# Patient Record
Sex: Female | Born: 1975 | Race: White | Hispanic: Yes | Marital: Married | Smoking: Never smoker
Health system: Southern US, Community
[De-identification: ages and names within clinical notes are randomized; demographics above are authoritative.]

## PROBLEM LIST (undated history)

## (undated) DIAGNOSIS — R7303 Prediabetes: Secondary | ICD-10-CM

## (undated) HISTORY — PX: COSMETIC SURGERY: SHX468

## (undated) HISTORY — PX: BREAST SURGERY: SHX581

---

## 2002-06-23 HISTORY — PX: AUGMENTATION MAMMAPLASTY: SUR837

## 2009-01-31 ENCOUNTER — Encounter: Admission: RE | Admit: 2009-01-31 | Discharge: 2009-01-31 | Payer: Self-pay | Admitting: Obstetrics and Gynecology

## 2012-07-29 ENCOUNTER — Ambulatory Visit: Payer: Self-pay | Admitting: Emergency Medicine

## 2012-07-29 VITALS — BP 114/76 | HR 90 | Temp 98.3°F | Resp 16 | Ht 63.25 in | Wt 197.6 lb

## 2012-07-29 DIAGNOSIS — J018 Other acute sinusitis: Secondary | ICD-10-CM

## 2012-07-29 DIAGNOSIS — J209 Acute bronchitis, unspecified: Secondary | ICD-10-CM

## 2012-07-29 MED ORDER — AMOXICILLIN-POT CLAVULANATE 875-125 MG PO TABS: 1.0000 | ORAL_TABLET | Freq: Two times a day (BID) | ORAL | Status: DC

## 2012-07-29 MED ORDER — HYDROCOD POLST-CHLORPHEN POLST 10-8 MG/5ML PO LQCR: 5.0000 mL | Freq: Two times a day (BID) | ORAL | Status: DC | PRN

## 2012-07-29 MED ORDER — PSEUDOEPHEDRINE-GUAIFENESIN ER 60-600 MG PO TB12: 1.0000 | ORAL_TABLET | Freq: Two times a day (BID) | ORAL | Status: AC

## 2012-07-29 NOTE — Patient Instructions (Signed)
Bronquitis (Bronchitis) La bronquitis es [employer] (el modo que tiene el organismo de Publishing rights manager a una lesin o infeccin) de los bronquios Los bronquios son los conductos que se extienden desde la trquea [employer]. Si la inflamacin se agrava, puede causar la falta de aire. CAUSAS Las causas de la inflamacin pueden ser:  Un virus  Grmenes (bacteria).  Polvo  Alergenos  La polucin y muchos otros irritantes Las clulas que revisten el rbol bronquial estn cubiertas con pequeos pelos (cilias). Esta constantemente producen un movimiento desde los pulmones hacia la boca. De este modo se mantienen los pulmones libres de polucin. Cuando estas clulas se irritan y no pueden cumplir su funcin, comienza a formarse la mucosidad. Esto produce la caracterstica tos de la bronquitis. La tos es el mecanismo por el cual se limpian los pulmones cuando las cilias no pueden cumplir su funcin. Sin alguno de [employer], Animator se Psychologist, clinical los pulmones Entonces se desarrollara una pulmona.  El fumar es una de las causas ms frecuentes de bronquitis y puede contribuir a la neumona. Abandonar este hbito es lo ms importante que puede hacer para beneficiarse. TRATAMIENTO  El [employer] prescribir antibiticos si la causa es una bacteria, y medicamentos para abrir las vas areas y Personnel officer. Tambin puede recomendar o prescribir un expectorante. El expectorante aflojar la mucosidad para que pueda eliminarla. Slo tome medicamentos de Sales promotion account executive o prescriptos para Primary school teacher, las Kimberly, o bajar la fiebre segn las indicaciones de su mdico.  Pharmacologist todo lo que causa el problema (por ejemplo el hbito de Art therapist) es fundamental para evitar que empeore.  Un antitusgeno puede prescribirse para [employer] de la tos.  Podrn indicarle inhalantes para aliviar los sntomas actuales y ayudar a prevenir problemas futuros.  Aquellos  que sufren bronquitis crnica (recurrente) puede ser necesaria la administracin de corticoides. SOLICITE ATENCIN MDICA INMEDIATAMENTE SI:  Durante el tratamiento observa que elimina esputo similar a pus (purulento).  Tiene fiebre.  Su beb tiene ms de 3 meses y su temperatura rectal es de 102 F (38.9 C) o ms.  Su beb tiene 3 meses o menos y su temperatura rectal es de 100.4 F (38 C) o ms.  Se siente cada vez ms enfermo.  Tiene cada vez ms dificultad para respirar, tiene ruidos al respirar o Company secretary. Es necesario buscar atencin mdica inmediata si es [place] persona de edad avanzada o sufre alguna otra enfermedad. ASEGURESE DE QUE:   Comprende estas instrucciones.  Controlar su enfermedad.  Solicitar ayuda inmediatamente si no mejora o si empeora. Document Released: 06/09/2005 Document Revised: 09/01/2011 [facility] Patient Information 2013 [place], [place]. Sinusitis  (Sinusitis) La sinusitis es el enrojecimiento, dolor e hinchazn (inflamacin) de los senos paranasales. Los senos paranasales son bolsas de aire que se encuentran dentro de los [employer] del rostro (por debajo de los ojos, en la mitad de la frente o por encima de los ojos). En los senos paranasales sanos, el moco es capaz de Sales executive y el aire circula a travs de ellos en su camino hacia la Clinical cytogeneticist. Sin embargo, cuando se [place], el moco y el aire [place]. Esto hace que se desarrollen bacterias y otros grmenes y originen una infeccin.   La sinusitis puede desarrollarse rpidamente y durar slo un tiempo corto (aguda) o continuar por un perodo largo (crnica).. La sinusitis que dura ms de 12 semanas se considera crnica.  CAUSAS  Las causas de la sinusitis  son:   Lyda Perone.  Las [employer], como el desplazamiento del cartlago que separa las fosas nasales (desvo del tabique) pueden disminuir el flujo de aire por la nariz y los senos paranasales y Audiological scientist su drenaje.  Las  alteraciones funcionales, como cuando los pequeos pelos (cilias) que se encuentran en los senos nasales y ayudan a eliminar la mucosidad no funcionan correctamente o no estn presentes. SNTOMAS  Los sntomas de sinusitis aguda y crnica son los mismos. Los sntomas principales son Chief Technology Officer y la presin alrededor de los senos paranasales afectados. Otros sntomas son:   Careers information officer.  Dolor de odos  Dolor de [place].  Mal aliento.  Disminucin del sentido del olfato y del gusto.  Tos, que empeora al [employer]  Fatiga.  Grant Ruts.  Drenaje de moco espeso por la nariz, que generalmente es de color verde y puede contener pus (purulento).  Hinchazn y calor en los senos paranasales afectados. DIAGNSTICO  El mdico le har un examen fsico. Durante el examen, el mdico:   Revisar su nariz buscando signos de crecimientos anormales en las fosas nasales (plipos nasales).  Palpar los senos paranasales afectados para buscar signos de infeccin.  Observar el interior de los senos paranasales (endoscopa) con un dispositivo luminoso especial (endoscopio) con el que tomar imgenes insertndolo en los senos paranasales. Si el mdico sospecha que usted sufre sinusitis crnica, podr indicar una o ms de las siguientes pruebas:   Pruebas de Programmer, multimedia.  Cultivo de secreciones nasales: tomar Lauris Poag del moco nasal y lo enviar a un laboratorio para detectar bacterias.  Citologa nasal: el mdico tomar [place] de moco de la nariz para determinar si la sinusitis que usted sufre est relacionada con Vella Raring. TRATAMIENTO  La mayora de los casos de sinusitis aguda se deben a una infeccin viral y se resuelven espontneamente dentro de los [address]. En algunos casos se recetan medicamentos para [employer] (analgsicos, descongestivos, aerosoles nasales con corticoides o aerosoles salinos).  Sin embargo, para la sinusitis por infeccin bacteriana, Engineer, manufacturing systems. Los antibiticos son medicamentos que destruyen las bacterias que causan la infeccin.  Rara vez la sinusitis tiene su origen en una infeccin por hongos. . En estos casos, el mdico le recetar un medicamento antifngico.  Para algunos casos de sinusitis crnica, es necesario someterse a [place]. Generalmente se trata de [employer] la sinusitis se repite ms de 3 veces al ao, a pesar de otros tratamientos.  INSTRUCCIONES PARA EL CUIDADO EN EL HOGAR   Beba gran cantidad de lquidos. Los lquidos ayudan a Optometrist moco para que drene ms fcilmente de los senos paranasales.  Use un humidificador.  Inhale vapor de 3 a 4 veces al da (por ejemplo, sintese en el bao con la ducha abierta).  Aplique un pao tibio y hmedo en el rostro 3  4 veces al da, o segn las indicaciones de su mdico.  Use un aerosol nasal salino para ayudar a Environmental education officer y [employer] senos nasales.  Tome medicamentos de venta libre o recetados para Acupuncturist, Environmental health practitioner o la fiebre slo segn las indicaciones de su mdico. SOLICITE ATENCIN MDICA DE INMEDIATO SI:   Siente ms dolor o sufre dolores de cabeza intensos.  Tiene nuseas, vmitos o somnolencia.  Observa hinchazn alrededor del rostro.  Tiene problemas de visin.  Presenta rigidez en el cuello.  Tiene dificultad para respirar. ASEGRESE DE QUE:   Comprende estas  instrucciones.  Controlar su enfermedad.  Solicitar ayuda de inmediato si no mejora o si empeora. Document Released: 03/19/2005 Document Revised: 09/01/2011 [facility] Patient Information 2013 [place], [place].

## 2012-07-29 NOTE — Progress Notes (Signed)
[facility] [address] [place] [address] [phone]  Date:  07/29/2012   Name:  Ashley Golden   DOB:  November 23, 1975   MRN:  [id number]  PCP:  No primary provider on file.    Chief Complaint: Cough and Nasal Congestion   History of Present Illness:  Ashley Golden is a 37 y.o. very pleasant female patient who presents with the following:  Ill for three days. Has nasal congestion and watery drainage and post nasal drip.  Has a cough productive of green sputum.  No fever but chills.  Malaise, fatigue and chest discomfort.  No wheezing or shortness of breath. No nausea or vomiting.  No improvement with OTC.  There is no problem list on file for this patient.   No past medical history on file.  Past Surgical History  Procedure Date  . Breast surgery   . Cosmetic surgery     History  Substance Use Topics  . Smoking status: Never Smoker   . Smokeless tobacco: Not on file  . Alcohol Use: No    No family history on file.  No Known Allergies  Medication list has been reviewed and updated.  No current outpatient prescriptions on file prior to visit.    Review of Systems:  As per HPI, otherwise negative.    Physical Examination: Filed Vitals:   07/29/12 1644  BP: 114/76  Pulse: 90  Temp: 98.3 F (36.8 C)  Resp: 16   Filed Vitals:   07/29/12 1644  Height: 5' 3.25" (1.607 m)  Weight: 197 lb 9.6 oz (89.631 kg)   Body mass index is 34.73 kg/(m^2). Ideal Body Weight: Weight in (lb) to have BMI = 25: 142   GEN: WDWN, NAD, Non-toxic, A & O x 3  No rash or sepsis HEENT: Atraumatic, Normocephalic. Neck supple. No masses, No LAD. Ears and Nose: No external deformity.  TM negative.  Green nasal drainage CV: RRR, No M/G/R. No JVD. No thrill. No extra heart sounds. PULM: CTA B, no wheezes, crackles, rhonchi. No retractions. No resp. distress. No accessory muscle use. ABD: S, NT, ND, +BS. No rebound. No HSM. EXTR: No c/c/e NEURO Normal  gait.  PSYCH: Normally interactive. Conversant. Not depressed or anxious appearing.  Calm demeanor.    Assessment and Plan: Sinusitis Bronchitis augmentin tussionex mucinex Follow up as needed  Carmelina Dane, MD

## 2014-01-02 ENCOUNTER — Ambulatory Visit (INDEPENDENT_AMBULATORY_CARE_PROVIDER_SITE_OTHER): Payer: Self-pay | Admitting: Gynecology

## 2014-01-02 ENCOUNTER — Encounter: Payer: Self-pay | Admitting: Gynecology

## 2014-01-02 ENCOUNTER — Other Ambulatory Visit (HOSPITAL_COMMUNITY)
Admission: RE | Admit: 2014-01-02 | Discharge: 2014-01-02 | Disposition: A | Discharge status: Home / Self Care | Payer: Self-pay | Source: Ambulatory Visit | Attending: Gynecology | Admitting: Gynecology

## 2014-01-02 VITALS — BP 128/86 | Ht 61.75 in | Wt 197.0 lb

## 2014-01-02 DIAGNOSIS — N852 Hypertrophy of uterus: Secondary | ICD-10-CM

## 2014-01-02 DIAGNOSIS — B9689 Other specified bacterial agents as the cause of diseases classified elsewhere: Secondary | ICD-10-CM

## 2014-01-02 DIAGNOSIS — N898 Other specified noninflammatory disorders of vagina: Secondary | ICD-10-CM

## 2014-01-02 DIAGNOSIS — N946 Dysmenorrhea, unspecified: Secondary | ICD-10-CM

## 2014-01-02 DIAGNOSIS — A499 Bacterial infection, unspecified: Secondary | ICD-10-CM

## 2014-01-02 DIAGNOSIS — N760 Acute vaginitis: Secondary | ICD-10-CM

## 2014-01-02 DIAGNOSIS — N9489 Other specified conditions associated with female genital organs and menstrual cycle: Secondary | ICD-10-CM

## 2014-01-02 DIAGNOSIS — Z01419 Encounter for gynecological examination (general) (routine) without abnormal findings: Secondary | ICD-10-CM | POA: Insufficient documentation

## 2014-01-02 DIAGNOSIS — IMO0002 Reserved for concepts with insufficient information to code with codable children: Secondary | ICD-10-CM | POA: Insufficient documentation

## 2014-01-02 DIAGNOSIS — Z1151 Encounter for screening for human papillomavirus (HPV): Secondary | ICD-10-CM | POA: Insufficient documentation

## 2014-01-02 DIAGNOSIS — Z1239 Encounter for other screening for malignant neoplasm of breast: Secondary | ICD-10-CM | POA: Insufficient documentation

## 2014-01-02 LAB — WET PREP FOR TRICH, YEAST, CLUE
CLUE CELLS WET PREP: NONE SEEN
Trich, Wet Prep: NONE SEEN
YEAST WET PREP: NONE SEEN

## 2014-01-02 MED ORDER — IBUPROFEN 800 MG PO TABS: 800.0000 mg | ORAL_TABLET | Freq: Three times a day (TID) | ORAL | Status: DC | PRN

## 2014-01-02 MED ORDER — METRONIDAZOLE 0.75 % VA GEL: 1.0000 | Freq: Two times a day (BID) | VAGINAL | Status: DC

## 2014-01-02 NOTE — Patient Instructions (Addendum)
Autoexamen de mamas  (Breast Self-Awareness) El autoexamen de mamas puede detectar problemas de manera temprana, prevenir complicaciones mdicas significativas y posiblemente salvar su vida. Al hacerlo, podr familiarizarse con el aspecto y forma de sus mamas, y observar cambios. Esto le permite descubrir cambios de manera precoz. Este autoexamen mensual le ofrece la tranquilidad de que sus senos estn en buen estado de salud. Una forma de aprender qu es normal para sus mamas y si sufren modificaciones es hacer un autoexamen.   Si encuentra un bulto o algo que no estaba presente anteriormente, lo mejor es ponerse en contacto con su mdico inmediatamente. Otro hallazgo que debe ser evaluado por su mdico es la secrecin del pezn, especialmente si es con sangre; cambios en la piel o enrojecimiento; reas donde la piel parece estar tironeada (retrada) o nuevos bultos o protuberancias. El dolor en los senos es rara vez se asocia con el cncer (malignidad), pero tambin debe ser evaluado por un mdico.  CMO REALIZAR EL AUTOEXAMEN DE MAMAS  El mejor momento para examinar sus mamas es a los 5 a 7 das despus de finalizado el perodo menstrual. Durante la menstruacin, las mamas estn ms abultadas y puede haber ms dificultad para encontrar modificaciones. Si no menstra, ha llegado a la menopausia, o le han extirpado el tero (histerectomia), usted debe examinar sus senos a intervalos regulares, por ejemplo cada mes. Si est amamantando, examine sus senos despus de alimentar al beb o despus de usar un extractor de leche. Los implantes mamarios no disminuyen el riesgo de bultos o tumores, por lo que debe seguir realizando el autoexamen de mamas como se recomienda. Hable con su mdico acerca de cmo determinar la diferencia entre el implante y el tejido mamario. Adems, debe consultar cuanta presin debe hacer durante el examen. Con el tiempo se familiarizar con las variaciones de las mamas y se sentir ms  cmoda para realizar el examen. Para el autoexamen deber quitarse toda la ropa de la cintura para arriba.  1.  Observe sus senos y pezones. Prese frente a un espejo en una habitacin con buena iluminacin. Con las manos en las caderas, presione las manos firmemente hacia abajo. Busque diferencias en la forma, el contorno y el tamao de un pecho al otro (asimetras). Entre las asimetras se incluyen arrugas, depresiones o protuberancias. Tambin, busque cambios en la piel, como reas enrojecidas o escamosas. Busque cambios en los pezones, como secreciones, hoyuelos, cambios en la posicin, o enrojecimiento. 2. Palpe cuidadosamente sus senos. Es mucho mejor hacerlo en la ducha o en la baera, mientras usa jabn o cuando est recostada sobre su espalda. Coloque el brazo (en el lado de la mama que se examina) por arriba de la cabeza. Use las yemas (no las puntas) de los tres dedos centrales de la mano opuesta para palpar. Comience en la zona de la axila, haga crculos de  de pulgada (2 cm) y vaya superponindolos. Utilice 3 niveles diferentes de presin (ligero, medio y firme) en cada crculo antes de pasar al siguiente. Se necesita una presin ligera para sentir los tejidos ms cercanos a la piel. La presin media ayudar a sentir el tejido mamario un poco ms profundo, mientras que se necesita una presin firme para palpar el tejido que se encuentra cerca de las costillas. Continuar superponiendo crculos y vaya hacia abajo, hasta sentir las costillas, por debajo del pecho. Luego mueva un espacio del ancho de un dedo hacia el centro del cuerpo. Siga con los crculos del  de pulgada (  2 cm) mientras va lentamente hacia la clavcula, cerca de la base del cuello. Contine con el examen hacia arriba y hacia abajo con las 3 intensidades de presin Librarian, academic a la mitad del pecho. Hgalo con cada seno cuidadosamente, buscando bultos o modificaciones. 3. Debe llevar un registro escrito con los cambios o los hallazgos  normales que encuentre para cada seno. Si registra esta informacin, no tiene que depender slo de la memoria para Financial trader, la sensibilidad o la ubicacin de los [place]. Anote en qu momento se encuentra del ciclo menstrual, si usted todava est menstruando. El tejido [employer] puede tener algunos bultos o tejidos engrosados. Sin embargo, consulte a su mdico si usted Location manager.   SOLICITE ATENCIN MDICA SI:   Observa cambios en la forma, en el contorno o el tamao de las mamas o los pezones.   Hay modificaciones en la piel, como zonas enrojecidas o escamosas en las mamas o en los pezones.   Tiene una secrecin anormal en los pezones.   Siente un nuevo bulto o reas engrosadas de [place] anormal.  Document Released: 06/09/2005 Document Revised: 05/26/2012 [facility] Patient Information 2015 [place], [place]. This information is not intended to replace advice given to you by your health care provider. Make sure you discuss any questions you have with your health care provider. Ecografa transvaginal (Transvaginal Ultrasound) La ecografa transvaginal es una ecografa plvica en la que se utiliza una probeta metlica que se coloca en la vagina, para observar los rganos femeninos. El ecgrafo enva ondas sonoras desde un transductor (sonda). Estas ondas sonoras chocan contra las estructuras del cuerpo (como un eco) y crean Proofreader. La imagen se observa en un monitor. Se denomina transvaginal debido a que la sonda se inserta dentro de la vagina. Puede haber una pequea molestia por la introduccin de la sonda. Esta prueba tambin puede realizarse [employer]. La ecografa endovaginal es otro nombre para la ecografa transvaginal. En una ecografa transabdominal, la sonda se coloca en la parte externa del abdomen. Este mtodo no ofrece imgenes tan buenas como la tcnica transvaginal. La ecogafa transvaginal se utiliza para observar alteraciones en el  tracto genital femenino. Entre ellos se incluyen:  Problemas de infertilidad.  Malformaciones congnitas (defecto de nacimiento) del tero y los ovarios.  Tumores en el tero.  Hemorragias anormales.  Tumores y quistes de ovario.  Abscesos (tejidos inflamados y pus) en la pelvis.  Dolor abdominal o plvico sin causa aparente.  Infecciones plvicas. DURANTE EL EMBARAZO, SE UTILIZA PAR OBSERVAR:  Embarazos normales.  Un embarazo ectpico (embarazo fuera del tero).  Latidos cardacos fetales.  Anormalidades de la pelvis que no se observan bien con la ecografa transabdominal.  Sospecha de gemelos o embarazo mltiple.  Aborto inminente.  Problemas en el cuello del tero (cuello incompetente, no permanece cerrado para contener al beb).  Cuando se realiza una amniocentesis (se retira lquido de la bolsa [place], para ser [place]).  Al buscar anormalidades en el beb.  Para controlar el crecimiento, el desarrollo y la edad del feto.  Para medir la cantidad de lquido en el saco amnitico.  Cuando se realiza una versin externa del beb (se lo mueve a Programmer, applications).  Evaluar al beb en embarazos de alto riesgo (perfil biofsico).  Si se sospecha el deceso del beb (muerte). En algunos casos, se utiliza un mtodo especial denominado ecografa con infusin salina, para una observacin ms precisa del tero. Se inyecta solucin salina (agua con sal)  dentro del tero en pacientes no embarazadas para observar mejor su interior. Este mtodo no se Designer, fashion/clothing. La probeta tambin puede usarse para obtener biopsias de Educational psychologist, para drenar lquido de quistes de ovario y para Designer, jewellery un DIU (dispositivo intrauterino para el control de la natalidad) que no pueda [place]. PREPARACIN PARA LA PRUEBA La ecografa transvaginal se realiza con la vejiga vaca. La ecografa transabdominal se realiza con la vejiga llena. Podrn solicitarle que beba varios  vasos de agua antes del examen. En algunos casos se realiza una ecografa transabdominal antes de la ecografa transvaginal para obervar los rganos del abdomen. PROCEDIMIENTO  Deber acostarse en una cama, con las rodillas dobladas y los pies en los estribos. La probeta se cubre con un condn. Dentro de la vagina y en la probeta se aplica un lubricante estril. El lubricante ayuda a transmitir las ondas sonoras y Fish farm manager la irritacin de la vagina. El mdico mover la sonda en el interior de la cavidad vaginal para escanear las estructuras plvicas. Un examen normal mostrar una pelvis normal y contenidos normales en su interior. Una prueba anormal mostrar anormalidades en la pelvis, la placenta o el beb. LAS CAUSAS DE UN RESULTADO ANORMAL PUEDEN SER:  Crecimientos o tumores en:  El tero.  Los ovarios.  La vagina.  Otras estructuras plvicas.  Crecimientos no cancerosos en el tero y los ovarios.  El ovario se retuerce y se corta el suministro de [place] (torsin [place]).  Las reas de infeccin incluyen:  Enfermedad inflamatoria plvica.  Un absceso en la pelvis.  Ubicacin de un DIU. LOS PROBLEMAS QUE PUEDEN HALLARSE EN UNA MUJER EMBARAZADA SON:  Embarazo ectpico (embarazo fuera del tero).  Embarazos mltiples.  Dilatacin (apertura) precoz anormal del cuello del tero. Esto puede indicar un cuello incompetente y Biomedical scientist.  Aborto inminente.  Muerte fetal.  Los problemas con la placenta incluyen:  La placenta se ha desarrollado sobre la abertura del cuello del tero (placenta previa).  La placenta se ha separado anticipadamente en el tero (abrupcin placentaria).  La placenta se desarrolla en el msculo del tero (placenta acreta).  Tumores del Media planner, incluyendo la enfermedad trofoblstica gestacional. Se trata de un embarazo anormal en el que no hay feto. El tero se llena de quistes similares a uvas que en algunos casos son cancerosos.  Posicin  incorrecta del feto (de nalgas, de vrtice).  Retraso del desarrollo fetal intrauterino (escaso desarrollo en el tero).  Anormalidades o infeccin fetal. RIESGOS Y COMPLICACIONES No hay riesgos conocidos para la ecografa. No se toman radiografas cuando se realiza una ecografa. Document Released: 09/25/2008 Document Revised: 09/01/2011 [facility] Patient Information 2015 [place]. This information is not intended to replace advice given to you by your health care provider. Make sure you discuss any questions you have with your health care provider. Fibroma uterino (Uterine Fibroid) Un fibroma uterino es un crecimiento (tumor) dentro del tero. Este tipo de tumor no es Radio broadcast assistant y no se extiende fuera del tero. Podr tener uno o varios fibromas. Los fibromas pueden variar en tamao, peso y TEFL teacher en que se desarrollan dentro del tero. Algunos pueden llegar a ser bastante grandes. La mayora de los fibromas no necesitan tratamiento mdico, pero algunos pueden causar dolor o sangrado abundante durante los perodos y [place]. CAUSAS  Un fibroma es el resultado del desarrollo continuo de una nica clula uterina que sigue creciendo (no regulada) que es diferente al resto de las clulas del cuerpo humano. La  mayora de las clulas tiene un mecanismo de control que evita que se reproduzcan de Research officer, trade union.  SIGNOS Y SNTOMAS   Hemorragias.  Dolor y sensacin de presin en la pelvis.  Problemas en la vejiga debido al tamao del fibroma.  Infertilidad y abortos espontneos, segn el tamao y la ubicacin del fibroma. DIAGNSTICO  Los fibromas uterinos se diagnostican con un examen fsico. El mdico puede palpar los tumores abultados al realizar el examen de la pelvis. Una ecografa puede indicarse para tener informacin del tamao, la ubicacin y el nmero de tumores.  TRATAMIENTO   El mdico puede considerar que es conveniente esperar y Barrister's clerk. Esto incluye el  control del fibroma por parte del mdico para observar si crece o disminuye su tamao.  Podr indicarle un tratamiento hormonal o el uso de un dispositivo intrauterino (DIU).  En algunos casos es necesaria la ciruga para extirpar el fibroma (miomectoma) o el tero (histerectoma). Esto depender de su situacin. Cuando una mujer desea quedar embarazada y los fibromas interfieren en su fertilidad, el mdico puede recomendar la extirpacin del fibroma.  INSTRUCCIONES PARA EL CUIDADO EN EL HOGAR  Los cuidados en el hogar dependen del tratamiento que haya recibido. En general:   Cumpla con todas las visitas de control, segn le indique su mdico.  Tome slo medicamentos de venta libre o recetados, segn las indicaciones del mdico. Si le recetaron un tratamiento hormonal, tome los medicamentos hormonales como le indicaron. No tome aspirina. Puede ocasionar hemorragias.  Consulte al mdico si debe tomar pldoras de hierro.  Si sus perodos son molestos pero no tan abundantes, acustese con los pies ligeramente elevados por encima del nivel del corazn. Coloque compresas fras en la zona inferior del abdomen.  Si sus perodos son muy abundantes, anote el nmero de compresas o tampones que [place] cada mes. Lleve esta informacin a su consulta mdica.  Incluya vegetales verdes en su dieta. SOLICITE ATENCIN MDICA DE INMEDIATO SI:  Siente dolor o clicos en la pelvis y no puede controlarlos con los medicamentos.  El dolor en la pelvis aumenta de manera repentina.  Aumenta el sangrado entre los perodos o [employer].  Si tiene perodos muy abundantes y debe cambiar un tampn o una toalla higinica cada media hora o menos.  Se siente mareado o tiene episodios de [place]. Document Released: 06/09/2005 Document Revised: 03/30/2013 [facility] Patient Information 2015 [place], [place]. This information is not intended to replace advice given to you by your health care provider. Make sure you  discuss any questions you have with your health care provider. Vaginosis bacteriana (Bacterial Vaginosis) La vaginosis bacteriana es una infeccin vaginal que perturba el equilibrio normal de las bacterias que se encuentran en la vagina. Es el resultado de un crecimiento excesivo de ciertas bacterias. Esta es la infeccin vaginal ms frecuente en mujeres en edad reproductiva. El tratamiento es importante para prevenir complicaciones, especialmente en mujeres embarazadas, dado que puede causar un parto prematuro. CAUSAS  La vaginosis bacteriana se origina por un aumento de bacterias nocivas que, generalmente, estn presentes en cantidades ms pequeas en la vagina. Varios tipos diferentes de bacterias pueden causar esta afeccin. Sin embargo, la causa de su desarrollo no se comprende totalmente. [place] o comportamientos pueden exponerlo a un mayor riesgo de desarrollar vaginosis bacteriana, entre los que se incluyen:  Tener una nueva pareja sexual o mltiples parejas sexuales.  Las duchas vaginales  El uso del DIU (dispositivo intrauterino) como mtodo anticonceptivo. El contagio  no se produce en baos, por ropas de cama, en piscinas o por contacto con objetos. SIGNOS Y SNTOMAS  Algunas mujeres que padecen vaginosis bacteriana no presentan signos ni sntomas. Los sntomas ms comunes son:  Secrecin vaginal de color grisceo.  Secrecin vaginal con olor similar al [employer], especialmente despus de Retail banker.  Picazn o sensacin de ardor en la vagina o la vulva.  Ardor o dolor al [employer]. DIAGNSTICO  Su mdico analizar su historia clnica y le examinar la vagina para detectar signos de vaginosis bacteriana. Puede tomarle Truddie Coco de flujo vaginal. Su mdico examinar esta muestra con un microscopio para controlar las bacterias y clulas anormales. Tambin puede realizarse un anlisis del pH vaginal.  TRATAMIENTO  La vaginosis  bacteriana puede tratarse con antibiticos, en forma de comprimidos o de crema vaginal. Puede indicarse una segunda tanda de antibiticos si la afeccin se repite despus del tratamiento.  [place] solo medicamentos de venta libre o recetados, segn las indicaciones del mdico.  Si le han recetado antibiticos, tmelos como se le indic. Asegrese de que finaliza la prescripcin completa aunque se sienta mejor.  No mantenga relaciones sexuales Animator.  Comunique a sus compaeros sexuales que sufre una infeccin vaginal. Deben consultar a su mdico y recibir tratamiento si tienen problemas, como picazn o una erupcin cutnea leve.  Practique el sexo seguro usando preservativos y tenga un nico compaero sexual. SOLICITE ATENCIN MDICA SI:   Sus sntomas no mejoran despus de 3 das de [place].  Aumenta la secrecin o Conservation officer, historic buildings.  Tiene fiebre. ASEGRESE DE QUE:   Comprende estas instrucciones.  Controlar su afeccin.  Recibir ayuda de inmediato si no mejora o si empeora. PARA OBTENER MS INFORMACIN  Centros para el control y la prevencin de Probation officer for Disease Control and Prevention, CDC): AppraiserFraud.fi Asociacin Estadounidense de la Salud Sexual (American Sexual Health Association, SHA): www.ashastd.org  Document Released: 09/16/2007 Document Revised: 03/30/2013 [facility] Patient Information 2015 [place], [place]. This information is not intended to replace advice given to you by your health care provider. Make sure you discuss any questions you have with your health care provider.

## 2014-01-02 NOTE — Progress Notes (Signed)
Ashley Golden July 16, 1975 [id number]   History:    38 y.o.  for annual gyn exam who is a new patient to the practice who was complaining of a vaginal odor and slight discharge. Patient reports being in a monogamous relationship. Patient suffers from dysmenorrhea and dyspareunia. Patient stated when she was young she had 1 pregnancy and was terminated electively but then she can married for many years and has been unable to conceive. She stated that in [facility] she had a full infertility evaluation with no etiology undetermined. She reports a normal HSG and husband normal semen analysis. Patient had silicone implants placed in [place]. And at age 10 she had a normal baseline mammogram. She's currently not using any contraception and is having normal menstrual cycles and has always had normal Pap smears.  Past medical history,surgical history, family history and social history were all reviewed and documented in the EPIC chart.  Gynecologic History Patient's last menstrual period was 12/29/2013. Contraception: none Last Pap: 3 years ago. Results were: normal Last mammogram: Baseline age 58. Results were: normal  Obstetric History OB History  Gravida Para Term Preterm AB SAB TAB Ectopic Multiple Living  1 0   1 1    0    # Outcome Date GA Lbr Len/2nd Weight Sex Delivery Anes PTL Lv  1 SAB                ROS: A ROS was performed and pertinent positives and negatives are included in the history.  GENERAL: No fevers or chills. HEENT: No change in vision, no earache, sore throat or sinus congestion. NECK: No pain or stiffness. CARDIOVASCULAR: No chest pain or pressure. No palpitations. PULMONARY: No shortness of breath, cough or wheeze. GASTROINTESTINAL: No abdominal pain, nausea, vomiting or diarrhea, melena or bright red blood per rectum. GENITOURINARY: No urinary frequency, urgency, hesitancy or dysuria. MUSCULOSKELETAL: No joint or muscle pain, no back pain, no recent  trauma. DERMATOLOGIC: No rash, no itching, no lesions. ENDOCRINE: No polyuria, polydipsia, no heat or cold intolerance. No recent change in weight. HEMATOLOGICAL: No anemia or easy bruising or bleeding. NEUROLOGIC: No headache, seizures, numbness, tingling or weakness. PSYCHIATRIC: No depression, no loss of interest in normal activity or change in sleep pattern.     Exam: chaperone present  BP 128/86  Ht 5' 1.75" (1.568 m)  Wt 197 lb (89.359 kg)  BMI 36.35 kg/m2  LMP 12/29/2013  Body mass index is 36.35 kg/(m^2).  General appearance : Well developed well nourished female. No acute distress HEENT: Neck supple, trachea midline, no carotid bruits, no thyroidmegaly Lungs: Clear to auscultation, no rhonchi or wheezes, or rib retractions  Heart: Regular rate and rhythm, no murmurs or gallops Breast:Examined in sitting and supine position were symmetrical in appearance, no palpable masses or tenderness,  no skin retraction, no nipple inversion, no nipple discharge, no skin discoloration, no axillary or supraclavicular lymphadenopathy Abdomen: no palpable masses or tenderness, no rebound or guarding Extremities: no edema or skin discoloration or tenderness  Pelvic:  Bartholin, Urethra, Skene Glands: Within normal limits             Vagina: No gross lesions or discharge  Cervix: No gross lesions or discharge  Uterus  12 week size irregular nontender  Adnexa  Without masses or tenderness  Anus and perineum  normal   Rectovaginal  normal sphincter tone without palpated masses or tenderness  Hemoccult not indicated   Wet prep few bacteria rare WBC  Assessment/Plan:  38 y.o. female for annual exam with odor vaginal discharge monogamous relationship questionable bacterial vaginosis she'll be placed on MetroGel vaginal to apply twice a day for one week.. Patient returned back to the office in 1-2 weeks for ultrasound. Patient not aware she had a fibroid uterus. Uterus felt 12 week size.  She reports normal menstrual cycle one week ago. The uterus feels irregular in shape. When she comes for ultrasound she'll come in the fasting state so that we can check the following labs: Fasting blood sugar, comprehensive metabolic panel, fasting lipid profile, TSH and urinalysis. Literature and information was provided in [place]. We discussed importance of monthly self breast exam.  Note: This dictation was prepared with  Dragon/digital dictation along withSmart phrase technology. Any transcriptional errors that result from this process are unintentional.   Terrance Mass MD, 5:36 PM 01/02/2014

## 2014-01-04 LAB — CYTOLOGY - PAP

## 2014-01-09 ENCOUNTER — Ambulatory Visit (INDEPENDENT_AMBULATORY_CARE_PROVIDER_SITE_OTHER): Payer: Self-pay | Admitting: Gynecology

## 2014-01-09 ENCOUNTER — Other Ambulatory Visit: Payer: Self-pay | Admitting: Gynecology

## 2014-01-09 ENCOUNTER — Encounter: Payer: Self-pay | Admitting: Gynecology

## 2014-01-09 ENCOUNTER — Ambulatory Visit (INDEPENDENT_AMBULATORY_CARE_PROVIDER_SITE_OTHER): Payer: Self-pay

## 2014-01-09 VITALS — BP 128/80

## 2014-01-09 DIAGNOSIS — D259 Leiomyoma of uterus, unspecified: Secondary | ICD-10-CM

## 2014-01-09 DIAGNOSIS — D251 Intramural leiomyoma of uterus: Secondary | ICD-10-CM

## 2014-01-09 DIAGNOSIS — N852 Hypertrophy of uterus: Secondary | ICD-10-CM

## 2014-01-09 DIAGNOSIS — R9389 Abnormal findings on diagnostic imaging of other specified body structures: Secondary | ICD-10-CM

## 2014-01-09 DIAGNOSIS — D252 Subserosal leiomyoma of uterus: Secondary | ICD-10-CM

## 2014-01-09 DIAGNOSIS — Z01419 Encounter for gynecological examination (general) (routine) without abnormal findings: Secondary | ICD-10-CM

## 2014-01-09 DIAGNOSIS — E7800 Pure hypercholesterolemia, unspecified: Secondary | ICD-10-CM

## 2014-01-09 DIAGNOSIS — IMO0002 Reserved for concepts with insufficient information to code with codable children: Secondary | ICD-10-CM

## 2014-01-09 DIAGNOSIS — N831 Corpus luteum cyst of ovary, unspecified side: Secondary | ICD-10-CM

## 2014-01-09 LAB — CBC WITH DIFFERENTIAL/PLATELET
BASOS PCT: 0 % (ref 0–1)
Basophils Absolute: 0 10*3/uL (ref 0.0–0.1)
EOS ABS: 0.1 10*3/uL (ref 0.0–0.7)
Eosinophils Relative: 1 % (ref 0–5)
HCT: 35.6 % — ABNORMAL LOW (ref 36.0–46.0)
HEMOGLOBIN: 12.2 g/dL (ref 12.0–15.0)
Lymphocytes Relative: 35 % (ref 12–46)
Lymphs Abs: 2.8 10*3/uL (ref 0.7–4.0)
MCH: 27.8 pg (ref 26.0–34.0)
MCHC: 34.3 g/dL (ref 30.0–36.0)
MCV: 81.1 fL (ref 78.0–100.0)
Monocytes Absolute: 0.5 10*3/uL (ref 0.1–1.0)
Monocytes Relative: 6 % (ref 3–12)
NEUTROS ABS: 4.7 10*3/uL (ref 1.7–7.7)
NEUTROS PCT: 58 % (ref 43–77)
PLATELETS: 250 10*3/uL (ref 150–400)
RBC: 4.39 MIL/uL (ref 3.87–5.11)
RDW: 13.5 % (ref 11.5–15.5)
WBC: 8.1 10*3/uL (ref 4.0–10.5)

## 2014-01-09 LAB — LIPID PANEL
Cholesterol: 166 mg/dL (ref 0–200)
HDL: 47 mg/dL (ref >39)
LDL CALC: 81 mg/dL (ref 0–99)
Total CHOL/HDL Ratio: 3.5 Ratio
Triglycerides: 189 mg/dL — ABNORMAL HIGH (ref <150)
VLDL: 38 mg/dL (ref 0–40)

## 2014-01-09 LAB — COMPREHENSIVE METABOLIC PANEL
ALBUMIN: 4 g/dL (ref 3.5–5.2)
ALK PHOS: 90 U/L (ref 39–117)
ALT: 15 U/L (ref 0–35)
AST: 17 U/L (ref 0–37)
BILIRUBIN TOTAL: 0.3 mg/dL (ref 0.2–1.2)
BUN: 13 mg/dL (ref 6–23)
CO2: 26 mEq/L (ref 19–32)
Calcium: 8.6 mg/dL (ref 8.4–10.5)
Chloride: 103 mEq/L (ref 96–112)
Creat: 0.68 mg/dL (ref 0.50–1.10)
Glucose, Bld: 97 mg/dL (ref 70–99)
POTASSIUM: 4 meq/L (ref 3.5–5.3)
SODIUM: 137 meq/L (ref 135–145)
TOTAL PROTEIN: 6.8 g/dL (ref 6.0–8.3)

## 2014-01-09 LAB — TSH: TSH: 3.668 u[IU]/mL (ref 0.350–4.500)

## 2014-01-09 NOTE — Progress Notes (Signed)
   Patient presented to the office today to discuss her ultrasound today. She was seen as a new patient in the practice in July 13 please see notes for details. During the examination she was found to have a 12 week size uterus and this is the reason for the ultrasound today.  Ultrasound today: Uterus measured 12.0 x 9.9 x 6.8 cm with endometrial stripe 11.4 mm. (Patient date 25 of cycle). Patient with multiple intramural and subserous myoma the largest one measuring 60 x 55 x 50 mm. Right ovary normal left ovary cyst measuring 20 x 17 mm with low level echoes negative color flow no fluid in the cul-de-sac.  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 Patient currently not seeking fertility. We'll recommend taking prenatal vitamins event that she were to conceive since they're not using any form of contraception. We did discuss the potential risk involved in an advanced maternal age pregnancy to include hypertension, diabetes, miscarriage, preterm delivery, and abnormal presentation as well as aneuploidy. She is having normal menstrual cycles. She will return back in 6 months for follow up ultrasound to monitor her these fibroids. If she becomes symptomatic before then we may proceed with a hysterectomy along with bilateral salpingectomy and ovarian conservation. Information was provided in [place]. Patient had her fasting blood work drawn today. Her recent Pap smear was normal.

## 2014-01-09 NOTE — Patient Instructions (Signed)
Informacin sobre Training and development officer (Hysterectomy Information)  La histerectoma es una ciruga que se realiza para extirpar el tero. Esta ciruga se puede realizar para tratar varios problemas mdicos. Despus de la ciruga, no volver a Personal assistant. Adems, debido a esta ciruga no podr quedar embarazada (estril). Asimismo, durante esta ciruga se podrn extirpar las trompas de falopio y los ovarios (salpingooforectoma bilateral).  RAZONES PARA REALIZAR UNA HISTERECTOMA  Sangrado persistente, anormal.  Dolor o infeccin persistente (crnico) en la pelvis.  El endometrio comienza a desarrollarse fuera del tero (endometriosis).  El endometrio comienza a desarrollarse en el msculo del tero (adenomiosis).  El tero desciende hacia la vagina (prolapso de los rganos de la pelvis).  Neoplasias benignas en el tero (fibromas uterinos) que provocan sntomas.  Clulas precancerosas.  Cncer cervical o cncer uterino. TIPOS DE HISTERECTOMA  Histerectoma supracervical: en este tipo de intervencin, se extirpa la parte superior del tero, pero no el cuello del tero.  Histerectoma total: se extirpan el tero y el cuello del tero.  Histerectoma radical: se extirpan el tero, el cuello del tero y los tejidos fibrosos que sostienen al tero en su lugar en la pelvis (parametrio). FORMAS EN QUE SE PUEDE REALIZAR UNA HISTERECTOMA  Histerectoma abdominal: se realiza un corte quirrgico grande (incisin) en el abdomen. Se extirpa el tero a travs de esta incisin.  Histerectoma vaginal: se realiza una incisin en la vagina. Se extirpa el tero a travs de esta incisin. No hay incisiones abdominales.  Histerectoma laparoscpica convencional: se realizan tres o cuatro incisiones pequeas en el abdomen. Se inserta un tubo delgado y luminoso con una cmara (laparoscopio) en una de las incisiones. A travs del resto de las incisiones se insertan otros instrumentos  quirrgicos. El tero se corta en trozos pequeos. Las piezas pequeas se eliminan a travs de las incisiones, o se retiran a travs de la vagina.  Histerectoma vaginal asistida por laparoscopia (HVAL): se realizan tres o cuatro incisiones pequeas en el abdomen. Parte de la ciruga se realiza por va laparoscpica y parte por va vaginal. El tero se extirpa a travs de la vagina.  Histerectoma laparoscpica asistida por robot: se insertan un laparoscopio y otros instrumentos quirrgicos en 3 o 4 incisiones pequeas en el abdomen. Se utiliza un dispositivo controlado por computadora para que el cirujano vea una imagen en 3D que lo ayuda a Chief Technology Officer los instrumentos quirrgicos. Esto permite movimientos ms precisos de los instrumentos quirrgicos. El tero se corta en trozos pequeos y se retira a travs de las incisiones o se elimina a travs de la vagina. RIESGOS Y [place] complicaciones asociadas con este procedimiento incluyen las siguientes:  Hemorragia y riesgos de Designer, industrial/product una transfusin sangunea. Infrmele al mdico si no quiere recibir ningn tipo de hemoderivados.  Cogulos de [employer] piernas o los pulmones.  Infeccin.  Lesin en los rganos circundantes.  Problemas o efectos secundarios relacionados con la anestesia.  Transformacin de cualquiera de las otras tcnicas en una histerectoma abdominal. QU ESPERAR DESPUES DE UNA HISTERECTOMA  Le darn medicamentos para el dolor.  Necesitar que alguien Nature conservation officer con usted durante los primeros 3 a 5 das despus de que regrese a Medical illustrator.  Tendr que ver al cirujano para un seguimiento 2 a 4 semanas despus de la ciruga para evaluar su progreso.  Posiblemente tenga sntomas tempranos de menopausia, como sofocos, sudoracin nocturna e insomnio.  Si le han realizado una histerectoma debido a un problema que no era cncer ni Ardelia Mems  afeccin que poda causar cncer, ya no necesitar una prueba de  Papanicolaou. Sin embargo, si ya no necesita hacerse un Papanicolau, es una buena idea hacerse un examen regularmente para asegurarse de que no hay otros problemas. Document Released: 06/09/2005 Document Revised: 03/30/2013 [facility] Patient Information 2015 [place], [place]. This information is not intended to replace advice given to you by your health care provider. Make sure you discuss any questions you have with your health care provider. Fibroma uterino (Uterine Fibroid) Un fibroma uterino es un crecimiento (tumor) dentro del tero. Este tipo de tumor no es Radio broadcast assistant y no se extiende fuera del tero. Podr tener uno o varios fibromas. Los fibromas pueden variar en tamao, peso y TEFL teacher en que se desarrollan dentro del tero. Algunos pueden llegar a ser bastante grandes. La mayora de los fibromas no necesitan tratamiento mdico, pero algunos pueden causar dolor o sangrado abundante durante los perodos y [place]. CAUSAS  Un fibroma es el resultado del desarrollo continuo de una nica clula uterina que sigue creciendo (no regulada) que es diferente al resto de las clulas del cuerpo humano. La mayora de las clulas tiene un mecanismo de control que evita que se reproduzcan de Research officer, trade union.  SIGNOS Y SNTOMAS   Hemorragias.  Dolor y sensacin de presin en la pelvis.  Problemas en la vejiga debido al tamao del fibroma.  Infertilidad y abortos espontneos, segn el tamao y la ubicacin del fibroma. DIAGNSTICO  Los fibromas uterinos se diagnostican con un examen fsico. El mdico puede palpar los tumores abultados al realizar el examen de la pelvis. Una ecografa puede indicarse para tener informacin del tamao, la ubicacin y el nmero de tumores.  TRATAMIENTO   El mdico puede considerar que es conveniente esperar y Barrister's clerk. Esto incluye el control del fibroma por parte del mdico para observar si crece o disminuye su tamao.  Podr indicarle un tratamiento  hormonal o el uso de un dispositivo intrauterino (DIU).  En algunos casos es necesaria la ciruga para extirpar el fibroma (miomectoma) o el tero (histerectoma). Esto depender de su situacin. Cuando una mujer desea quedar embarazada y los fibromas interfieren en su fertilidad, el mdico puede recomendar la extirpacin del fibroma.  INSTRUCCIONES PARA EL CUIDADO EN EL HOGAR  Los cuidados en el hogar dependen del tratamiento que haya recibido. En general:   Cumpla con todas las visitas de control, segn le indique su mdico.  Tome slo medicamentos de venta libre o recetados, segn las indicaciones del mdico. Si le recetaron un tratamiento hormonal, tome los medicamentos hormonales como le indicaron. No tome aspirina. Puede ocasionar hemorragias.  Consulte al mdico si debe tomar pldoras de hierro.  Si sus perodos son molestos pero no tan abundantes, acustese con los pies ligeramente elevados por encima del nivel del corazn. Coloque compresas fras en la zona inferior del abdomen.  Si sus perodos son muy abundantes, anote el nmero de compresas o tampones que [place] cada mes. Lleve esta informacin a su consulta mdica.  Incluya vegetales verdes en su dieta. SOLICITE ATENCIN MDICA DE INMEDIATO SI:  Siente dolor o clicos en la pelvis y no puede controlarlos con los medicamentos.  El dolor en la pelvis aumenta de manera repentina.  Aumenta el sangrado entre los perodos o [employer].  Si tiene perodos muy abundantes y debe cambiar un tampn o una toalla higinica cada media hora o menos.  Se siente mareado o tiene episodios de [place]. Document Released: 06/09/2005 Document Revised: 03/30/2013 ExitCare  Patient Information 2015 [employer]. This information is not intended to replace advice given to you by your health care provider. Make sure you discuss any questions you have with your health care provider.  

## 2014-01-10 LAB — URINALYSIS W MICROSCOPIC + REFLEX CULTURE
BILIRUBIN URINE: NEGATIVE
Casts: NONE SEEN
Crystals: NONE SEEN
Glucose, UA: NEGATIVE mg/dL
Hgb urine dipstick: NEGATIVE
Ketones, ur: NEGATIVE mg/dL
Leukocytes, UA: NEGATIVE
NITRITE: NEGATIVE
PROTEIN: NEGATIVE mg/dL
Specific Gravity, Urine: 1.022 (ref 1.005–1.030)
UROBILINOGEN UA: 0.2 mg/dL (ref 0.0–1.0)
pH: 5.5 (ref 5.0–8.0)

## 2014-01-11 ENCOUNTER — Other Ambulatory Visit: Payer: Self-pay | Admitting: Gynecology

## 2014-01-11 MED ORDER — NITROFURANTOIN MONOHYD MACRO 100 MG PO CAPS
100.0000 mg | ORAL_CAPSULE | Freq: Two times a day (BID) | ORAL | Status: DC
Start: 2014-01-11 — End: 2019-06-03

## 2014-01-12 LAB — URINE CULTURE

## 2014-04-24 ENCOUNTER — Encounter: Payer: Self-pay | Admitting: Gynecology

## 2014-07-05 ENCOUNTER — Other Ambulatory Visit: Payer: Self-pay

## 2014-07-05 ENCOUNTER — Ambulatory Visit: Payer: Self-pay | Admitting: Gynecology

## 2016-11-05 ENCOUNTER — Encounter: Payer: Self-pay | Admitting: Gynecology

## 2017-03-16 ENCOUNTER — Other Ambulatory Visit (HOSPITAL_COMMUNITY): Payer: Self-pay | Admitting: *Deleted

## 2017-03-16 DIAGNOSIS — N6320 Unspecified lump in the left breast, unspecified quadrant: Secondary | ICD-10-CM

## 2017-04-02 ENCOUNTER — Ambulatory Visit (HOSPITAL_COMMUNITY)
Admission: RE | Admit: 2017-04-02 | Discharge: 2017-04-02 | Disposition: A | Discharge status: Home / Self Care | Payer: Self-pay | Source: Ambulatory Visit | Attending: Obstetrics and Gynecology | Admitting: Obstetrics and Gynecology

## 2017-04-02 ENCOUNTER — Other Ambulatory Visit (HOSPITAL_COMMUNITY): Payer: Self-pay | Admitting: Obstetrics and Gynecology

## 2017-04-02 ENCOUNTER — Ambulatory Visit
Admission: RE | Admit: 2017-04-02 | Discharge: 2017-04-02 | Disposition: A | Discharge status: Home / Self Care | Payer: No Typology Code available for payment source | Source: Ambulatory Visit | Attending: Obstetrics and Gynecology | Admitting: Obstetrics and Gynecology

## 2017-04-02 ENCOUNTER — Encounter (HOSPITAL_COMMUNITY): Payer: Self-pay

## 2017-04-02 ENCOUNTER — Ambulatory Visit: Payer: Self-pay

## 2017-04-02 VITALS — BP 104/70 | Temp 97.6°F | Ht 62.0 in

## 2017-04-02 DIAGNOSIS — N6320 Unspecified lump in the left breast, unspecified quadrant: Secondary | ICD-10-CM

## 2017-04-02 DIAGNOSIS — N644 Mastodynia: Secondary | ICD-10-CM

## 2017-04-02 DIAGNOSIS — Z1239 Encounter for other screening for malignant neoplasm of breast: Secondary | ICD-10-CM

## 2017-04-02 NOTE — Progress Notes (Addendum)
Complaints of left outer breast pain x 1 year. Patient states the pain comes and goes. Patient rated the pain at a 5 out of 10.  Pap Smear: Pap smear not completed today. Last Pap smear was 01/02/2014 at Dr. Sandrea Hughs office and normal with negative HPV. Per patient has no history of an abnormal Pap smear.  Physical exam: Breasts Breasts symmetrical. No skin abnormalities bilateral breasts. No nipple retraction bilateral breasts. No nipple discharge bilateral breasts. No lymphadenopathy. No lumps palpated bilateral breasts. Complaints of left outer lower breast tenderness on exam. Referred patient to the [facility] for diagnostic mammogram and possible left breast ultrasound. Appointment scheduled for Thursday, April 02, 2017 at 1050.        Pelvic/Bimanual No Pap smear completed today since last Pap smear and HPV typing was 01/02/2014. Pap smear not indicated per BCCCP guidelines.   Smoking History: Patient has never smoked.  Patient Navigation: Patient education provided. Access to services provided for patient through [facility] program. Spanish interpreter provided.  Used Spanish interpreter [employer] from [place].

## 2017-04-02 NOTE — Patient Instructions (Signed)
Explained breast self awareness with Ripley Fraise. Patient did not need a Pap smear today due to last Pap smear and HPV typing was 01/02/2014. Let her know BCCCP will cover Pap smears and HPV typing every 5 years unless has a history of abnormal Pap smears. Referred patient to the [facility] for diagnostic mammogram and possible left breast ultrasound. Appointment scheduled for Thursday, April 02, 2017 at 1050. Ripley Fraise verbalized understanding.  Jaden Abreu, Arvil Chaco, RN 12:29 PM

## 2017-04-02 NOTE — Addendum Note (Signed)
Encounter addended by: Loletta Parish, RN on: 04/02/2017 12:46 PM<BR>    Actions taken: Sign clinical note

## 2017-04-03 ENCOUNTER — Encounter (HOSPITAL_COMMUNITY): Payer: Self-pay | Admitting: *Deleted

## 2018-04-26 ENCOUNTER — Other Ambulatory Visit (HOSPITAL_COMMUNITY): Payer: Self-pay | Admitting: *Deleted

## 2018-04-26 DIAGNOSIS — Z1231 Encounter for screening mammogram for malignant neoplasm of breast: Secondary | ICD-10-CM

## 2018-07-29 ENCOUNTER — Ambulatory Visit (HOSPITAL_COMMUNITY): Payer: No Typology Code available for payment source

## 2018-08-30 ENCOUNTER — Ambulatory Visit: Payer: No Typology Code available for payment source

## 2018-09-06 ENCOUNTER — Ambulatory Visit: Payer: No Typology Code available for payment source

## 2018-10-26 ENCOUNTER — Other Ambulatory Visit (HOSPITAL_COMMUNITY): Payer: Self-pay | Admitting: *Deleted

## 2018-10-26 DIAGNOSIS — N644 Mastodynia: Secondary | ICD-10-CM

## 2018-11-09 ENCOUNTER — Ambulatory Visit (HOSPITAL_COMMUNITY): Payer: No Typology Code available for payment source

## 2018-11-18 ENCOUNTER — Ambulatory Visit (HOSPITAL_COMMUNITY): Payer: No Typology Code available for payment source

## 2018-11-18 ENCOUNTER — Other Ambulatory Visit: Payer: No Typology Code available for payment source

## 2018-12-16 ENCOUNTER — Ambulatory Visit (HOSPITAL_COMMUNITY)
Admission: RE | Admit: 2018-12-16 | Discharge: 2018-12-16 | Disposition: A | Discharge status: Home / Self Care | Payer: No Typology Code available for payment source | Source: Ambulatory Visit | Attending: Obstetrics and Gynecology | Admitting: Obstetrics and Gynecology

## 2018-12-16 ENCOUNTER — Ambulatory Visit
Admission: RE | Admit: 2018-12-16 | Discharge: 2018-12-16 | Disposition: A | Discharge status: Home / Self Care | Payer: No Typology Code available for payment source | Source: Ambulatory Visit | Attending: Obstetrics and Gynecology | Admitting: Obstetrics and Gynecology

## 2018-12-16 ENCOUNTER — Other Ambulatory Visit: Payer: Self-pay

## 2018-12-16 ENCOUNTER — Encounter (HOSPITAL_COMMUNITY): Payer: Self-pay

## 2018-12-16 DIAGNOSIS — N644 Mastodynia: Secondary | ICD-10-CM

## 2018-12-16 NOTE — Progress Notes (Signed)
Complaints of left outer breast pain x 5 months that is worse when she lays on it. Patient states the pain causes her difficulty breathing. Patient rates the pain at a 5 out of 10.    Pap Smear: Pap smear not completed today. Last Pap smear was 01/02/2014 at Dr. Sandrea Hughs office and normal with negative HPV. Per patient has no history of an abnormal Pap smear.  Physical exam: Breasts Breasts symmetrical. No skin abnormalities bilateral breasts. No nipple retraction bilateral breasts. No nipple discharge bilateral breasts. No lymphadenopathy. No lumps palpated bilateral breasts. Complaints of left outer  breast tenderness on exam. Referred patient to the [facility] for diagnostic mammogram and possible left breast ultrasound. Appointment scheduled for Thursday, December 16, 2018 at 1120.       Pelvic/Bimanual No Pap smear completed today since last Pap smear and HPV typing was 01/02/2014. Pap smear not indicated per BCCCP guidelines.   Smoking History: Patient has never smoked.  Patient Navigation: Patient education provided. Access to services provided for patient through [facility] program. Spanish interpreter provided.   Breast and Cervical Cancer Risk Assessment: Patient has no family history of breast cancer, known genetic mutations, or radiation treatment to the chest before age 71. Patient has no history of cervical dysplasia, immunocompromised, or DES exposure in-utero.  Risk Assessment    Risk Scores      12/16/2018   Last edited by: Armond Hang, LPN   5-year risk: 0.5 %   Lifetime risk: 8.5 %         Used Spanish interpreter Rudene Anda from [place].

## 2018-12-16 NOTE — Patient Instructions (Signed)
Explained breast self awareness with Ripley Fraise. Patient did not need a Pap smear today due to last Pap smear and HPV typing was 01/02/2014. Let her know BCCCP will cover Pap smears and HPV typing every 5 years unless has a history of abnormal Pap smears. Appointment scheduled for patient at [facility] to have Pap smear completed on Tuesday, March 22, 2019 at 1230. Referred patient to the [facility] for diagnostic mammogram and possible left breast ultrasound. Appointment scheduled for Thursday, December 16, 2018 at 1120. Patient aware of appointments and will be there.  Ripley Fraise verbalized understanding.  Shakiyah Cirilo, Arvil Chaco, RN 10:52 AM

## 2018-12-17 ENCOUNTER — Encounter (HOSPITAL_COMMUNITY): Payer: Self-pay | Admitting: *Deleted

## 2019-01-17 ENCOUNTER — Other Ambulatory Visit (HOSPITAL_COMMUNITY): Payer: Self-pay

## 2019-01-17 ENCOUNTER — Other Ambulatory Visit: Payer: Self-pay

## 2019-01-17 DIAGNOSIS — D492 Neoplasm of unspecified behavior of bone, soft tissue, and skin: Secondary | ICD-10-CM

## 2019-01-20 ENCOUNTER — Other Ambulatory Visit: Payer: Self-pay

## 2019-01-20 ENCOUNTER — Ambulatory Visit (HOSPITAL_COMMUNITY)
Admission: RE | Admit: 2019-01-20 | Discharge: 2019-01-20 | Disposition: A | Discharge status: Home / Self Care | Payer: Self-pay | Source: Ambulatory Visit | Attending: Oral and Maxillofacial Surgery | Admitting: Oral and Maxillofacial Surgery

## 2019-01-20 DIAGNOSIS — D492 Neoplasm of unspecified behavior of bone, soft tissue, and skin: Secondary | ICD-10-CM | POA: Insufficient documentation

## 2019-01-20 MED ORDER — GADOBUTROL 1 MMOL/ML IV SOLN
10.0000 mL | Freq: Once | INTRAVENOUS | Status: AC | PRN
  Administered 2019-01-20: 17:00:00 10 mL via INTRAVENOUS

## 2019-03-22 ENCOUNTER — Other Ambulatory Visit: Payer: Self-pay

## 2019-03-22 ENCOUNTER — Encounter (HOSPITAL_COMMUNITY): Payer: Self-pay

## 2019-03-22 ENCOUNTER — Ambulatory Visit (HOSPITAL_COMMUNITY)
Admission: RE | Admit: 2019-03-22 | Discharge: 2019-03-22 | Disposition: A | Discharge status: Home / Self Care | Payer: No Typology Code available for payment source | Source: Ambulatory Visit | Attending: Obstetrics and Gynecology | Admitting: Obstetrics and Gynecology

## 2019-03-22 DIAGNOSIS — Z01419 Encounter for gynecological examination (general) (routine) without abnormal findings: Secondary | ICD-10-CM

## 2019-03-22 NOTE — Progress Notes (Signed)
Complaints of painful and heavy menstrual periods. Told patient can refer to the [facility] but B[facility]CP will not cover the visit. Told patient about the [facility] application. Patient agreed and will send referral. Patient verbalized understanding.  Pap Smear: Pap smear completed today. Last Pap smear was 01/02/2014 at Dr. Sandrea Hughs office and normal with negative HPV. Per patient has no history of an abnormal Pap smear.  Pelvic/Bimanual   Ext Genitalia No lesions, no swelling and no discharge observed on external genitalia.         Vagina Vagina pink and normal texture. No lesions or discharge observed in vagina.          Cervix Cervix is present. Cervix pink and of normal texture. No discharge observed.     Uterus Uterus is present and palpable. Uterus in normal position and enlarged. Per patient has a history of fibroids.        Adnexae Bilateral ovaries present and palpable. No tenderness on palpation.         Rectovaginal No rectal exam completed today since patient had no rectal complaints. No skin abnormalities observed on exam.    Smoking History: Patient has never smoked.  Patient Navigation: Patient education provided. Access to services provided for patient through [facility] program. Spanish interpreter provided.   Breast and Cervical Cancer Risk Assessment: Patient has no family history of breast cancer, known genetic mutations, or radiation treatment to the chest before age 61. Patient has no history of cervical dysplasia, immunocompromised, or DES exposure in-utero.  Risk Assessment    Risk Scores      03/22/2019 12/16/2018   Last edited by: Loletta Parish, RN Armond Hang, LPN   5-year risk: 0.6 % 0.5 %   Lifetime risk: 8.4 % 8.5 %         Used Spanish interpreter [employer] from [place].

## 2019-03-22 NOTE — Patient Instructions (Addendum)
Let Ashley Golden know BCCCP will cover Pap smears and HPV typing every 5 years unless has a history of abnormal Pap smears. Let patient know will follow up with her within the next couple weeks with results of Pap smear by letter or phone. Ashley Golden verbalized understanding.  Venisha Boehning, Arvil Chaco, RN 12:50 PM

## 2019-03-24 ENCOUNTER — Telehealth: Payer: Self-pay | Admitting: Family Medicine

## 2019-03-24 NOTE — Telephone Encounter (Signed)
Called patient with the interpreter [phone] to get her scheduled in the office. Left her a voice message to call the office.

## 2019-03-25 LAB — CYTOLOGY - PAP
Adequacy: ABSENT
Diagnosis: NEGATIVE
High risk HPV: NEGATIVE

## 2019-04-06 ENCOUNTER — Telehealth (HOSPITAL_COMMUNITY): Payer: Self-pay | Admitting: *Deleted

## 2019-04-06 NOTE — Telephone Encounter (Signed)
Called patient with Spanish interpreter Benjamine Sprague. Let patient know that her Pap smear was normal and HPV negative. Informed patient that her next Pap smear is due in 5 years. Patient verbalized understanding.

## 2019-04-13 ENCOUNTER — Inpatient Hospital Stay: Payer: Self-pay | Attending: Obstetrics and Gynecology | Admitting: *Deleted

## 2019-04-13 ENCOUNTER — Other Ambulatory Visit: Payer: Self-pay

## 2019-04-13 ENCOUNTER — Other Ambulatory Visit: Payer: Self-pay | Admitting: Obstetrics and Gynecology

## 2019-04-13 VITALS — BP 113/74 | Temp 97.1°F | Ht 63.0 in | Wt 211.0 lb

## 2019-04-13 DIAGNOSIS — Z0000 Encounter for general adult medical examination without abnormal findings: Secondary | ICD-10-CM

## 2019-04-13 NOTE — Progress Notes (Signed)
Wisewoman initial screening   interpreter- Shenoa Bachtell, Language Resources   Clinical Measurement:  Height: 63 in Weight: 211 lb  Blood Pressure: 123/73  Blood Pressure #2: 113/74 Fasting Labs Drawn Today, will review with patient when they result.   Medical History:  Patient states that she does not have a history of high cholesterol, high blood pressure and diabetes.  Medications:  Patient states that she does not take medication to lower cholesterol, blood pressure or blood sugar. Patient does not take an aspirin a day to help prevent a heart attack or stroke.    Blood pressure, self measurement: Patient states that she does not measure blood pressure from home and has not been told to do so by a health care provider.   Nutrition: Patient states that on average she eats 1 cup of fruit and 0  cups of vegetables per day. Patient states that she does not eat fish at least 2 times per week. Patient eats less than half servings of whole grains. Patient does not drink less than 36 ounces of beverages with added sugar weekly. Patient is currently watching sodium or salt intake. In the past 7 days patient has not had any drinks containing alcohol. On average patient does not drink any drinks containing alcohol.      Physical activity:  Patient states that she gets 0 minutes of moderate and 0 minutes of vigorous physical activity each week.  Smoking status:  Patient states that she has never smoked tobacco.   Quality of life:  Over the past 2 weeks patient states that she has not had several days where she has little interest or pleasure in doing things and several days where she has felt down, depressed or hopeless.    Risk reduction and counseling:    Health Coaching: Spoke with patient extensively about diet. Encouraged patient tot try and add more fruits and vegetables into daily diet. Went over daily recommendations for 2 fruits and 3 vegetables per day. Also talked about heart healthy fish  and gave examples of salmon, tuna and mackerel to try. Also gave examples of whole grains that she could try to incorporate into diet like whole wheat bread, brown rice, oatmeal or whole grain cereals. Encouraged patient to try and reduce the amount of beverages with added sugars that she consumes to 36 ounces or less per week. Finally talked about the importance of daily exercise.   Navigation:  I will notify patient of lab results.  Patient is aware of 2 more health coaching sessions and a follow up.  Time: 20 minutes

## 2019-04-14 LAB — HGB A1C W/O EAG: Hgb A1c MFr Bld: 5.8 % — ABNORMAL HIGH (ref 4.8–5.6)

## 2019-04-14 LAB — GLUCOSE, RANDOM: Glucose: 90 mg/dL (ref 65–99)

## 2019-04-14 LAB — LIPID PANEL W/O CHOL/HDL RATIO
Cholesterol, Total: 152 mg/dL (ref 100–199)
HDL: 44 mg/dL (ref >39)
LDL Chol Calc (NIH): 86 mg/dL (ref 0–99)
Triglycerides: 122 mg/dL (ref 0–149)
VLDL Cholesterol Cal: 22 mg/dL (ref 5–40)

## 2019-04-18 ENCOUNTER — Telehealth (HOSPITAL_COMMUNITY): Payer: Self-pay

## 2019-04-18 NOTE — Telephone Encounter (Signed)
Health coaching 2   interpreter- [place] cholesterol , 86 LDL cholesterol , 122 triglycerides , 44 HDL cholesterol , 5.8 hemoglobin A1C , 90 mean plasma glucose  Patient understands and is aware of her lab results.   Goals- Discussed lab results with patient in detail and answered any questions patient had regarding results.  Goals- Reduce the amount of beverages with added sugars weekly. Reduce the amount to 36 ounces or less per week. Reduce the amount of sweets consumed. Reduce the amount of carbs consumed. Increase daily exercise to at least 20 minutes a day. Increase the amount of fruits and vegetables consumed. Went over in detail about portions and what equals a portion.    Navigation:  Patient is aware of 1 more health coaching sessions and a follow up. Patient is scheduled for follow-up with Internal Medicine on Wednesday, October 28th @ 1:15 pm for elevated hemoglobin A1C.  Time- 24 minutes

## 2019-04-18 NOTE — Telephone Encounter (Signed)
Left message with patient about Ashley Golden lab results. Will call patient back later today via interpreter Rudene Anda.

## 2019-04-20 ENCOUNTER — Encounter: Payer: Self-pay | Admitting: Internal Medicine

## 2019-04-20 ENCOUNTER — Ambulatory Visit (INDEPENDENT_AMBULATORY_CARE_PROVIDER_SITE_OTHER): Payer: Self-pay | Admitting: Internal Medicine

## 2019-04-20 DIAGNOSIS — R7303 Prediabetes: Secondary | ICD-10-CM

## 2019-04-20 NOTE — Progress Notes (Signed)
   CC: Wise woman check. Prediabetic range A1C  HPI:  [name] is a 43 y.o. with past medical history below. Please see problem based charting for details of presentation, assessment, and plan.    History reviewed. No pertinent past medical history.  Past Surgical History:  Procedure Laterality Date  . AUGMENTATION MAMMAPLASTY Bilateral 2004  . BREAST SURGERY     SILICON  . COSMETIC SURGERY     History reviewed. No pertinent family history.  Review of Systems:  Review of Systems  Constitutional: Negative for chills and fever.  HENT: Negative for hearing loss and sore throat.   Eyes: Negative for blurred vision and double vision.  Respiratory: Negative for cough and sputum production.   Cardiovascular: Negative for chest pain and leg swelling.  Gastrointestinal: Negative for heartburn and nausea.  Genitourinary: Negative for dysuria and urgency.  Musculoskeletal: Negative for back pain and joint pain.  Neurological: Negative for dizziness and headaches.  Psychiatric/Behavioral: Negative for depression. The patient is nervous/anxious.      Physical Exam:  Vitals:   04/20/19 1347  BP: 112/73  Pulse: (!) 102  Temp: 99.3 F (37.4 C)  TempSrc: Oral  SpO2: 98%  Weight: 210 lb 9.6 oz (95.5 kg)  Height: 5\' 3"  (1.6 m)   Physical Exam Constitutional:      General: She is not in acute distress.    Appearance: She is obese.  Cardiovascular:     Rate and Rhythm: Normal rate and regular rhythm.     Heart sounds: No murmur. No friction rub. No gallop.   Pulmonary:     Breath sounds: No wheezing, rhonchi or rales.  Abdominal:     General: Bowel sounds are normal.     Tenderness: There is no abdominal tenderness.  Skin:    General: Skin is warm and dry.  Psychiatric:        Mood and Affect: Mood normal.        Behavior: Behavior normal.      Assessment & Plan:   See Encounters Tab for problem based charting.  Patient seen with Dr. Heber [place]

## 2019-04-20 NOTE — Patient Instructions (Signed)
Thank you for trusting me with your care. To recap, today we discussed the following:   Your Lipid Panel was normal.    Prediabetic , Hemoglobin A1C was 5.8%. Goal is less than 5.6%. - Aim to increased exercise and continue to work on diet.  - Follow up in 3 months and we can check your A1C  My best,  Tamsen Snider , MD

## 2019-04-25 NOTE — Progress Notes (Signed)
Internal Medicine Clinic Attending  I saw and evaluated the patient.  I personally confirmed the key portions of the history and exam documented by Dr. Steen and I reviewed pertinent patient test results.  The assessment, diagnosis, and plan were formulated together and I agree with the documentation in the resident's note.     

## 2019-05-02 ENCOUNTER — Ambulatory Visit (INDEPENDENT_AMBULATORY_CARE_PROVIDER_SITE_OTHER): Payer: Self-pay | Admitting: Obstetrics and Gynecology

## 2019-05-02 ENCOUNTER — Encounter: Payer: Self-pay | Admitting: Obstetrics and Gynecology

## 2019-05-02 ENCOUNTER — Other Ambulatory Visit: Payer: Self-pay

## 2019-05-02 VITALS — BP 111/76 | HR 99 | Ht 63.0 in | Wt 209.0 lb

## 2019-05-02 DIAGNOSIS — D219 Benign neoplasm of connective and other soft tissue, unspecified: Secondary | ICD-10-CM

## 2019-05-02 DIAGNOSIS — R102 Pelvic and perineal pain: Secondary | ICD-10-CM

## 2019-05-02 DIAGNOSIS — Z789 Other specified health status: Secondary | ICD-10-CM

## 2019-05-02 DIAGNOSIS — N939 Abnormal uterine and vaginal bleeding, unspecified: Secondary | ICD-10-CM

## 2019-05-02 DIAGNOSIS — Z113 Encounter for screening for infections with a predominantly sexual mode of transmission: Secondary | ICD-10-CM

## 2019-05-02 NOTE — Progress Notes (Signed)
GYNECOLOGY OFFICE FOLLOW UP NOTE  History:  43 y.o. G1P0010 here today for follow up for heavy periods. Lasting up to 7 days. Feels periods are heavier than they were in the past. In October, she had her period twice, normally around 28 days. On heavy days, she changes pads/tampons 5-6 times. Denies light-headedness or dizziness.  Has pain in lower abdomen, goes all the way around her waist. In the front, she feels like she has "swelling" and towards the back, feels like "burning". Feels this pain before and during her periods. Pain is worse on her heavy days. She must take naproxen during her heavy days. Has one day where it is hard to do daily activities. Headache and some dizziness during her period.   Has pain with intercourse, has been going on for at least 10 years. Was diagnosed with fibroids in 2015, was given the options of surgery or to wait for menopause. She did not want to have children, opted against surgery. Not using anything for contraception, not interested in having kids.   Pap earlier this year was normal.   History reviewed. No pertinent past medical history.  Past Surgical History:  Procedure Laterality Date  . AUGMENTATION MAMMAPLASTY Bilateral 2004  . BREAST SURGERY     SILICON  . COSMETIC SURGERY       Current Outpatient Medications:  .  ibuprofen (ADVIL,MOTRIN) 800 MG tablet, Take 1 tablet (800 mg total) by mouth every 8 (eight) hours as needed. (Patient not taking: Reported on 04/02/2017), Disp: 30 tablet, Rfl: 5 .  metroNIDAZOLE (METROGEL) 0.75 % vaginal gel, Place 1 Applicatorful vaginally 2 (two) times daily. (Patient not taking: Reported on 04/02/2017), Disp: 70 g, Rfl: 0 .  nitrofurantoin, macrocrystal-monohydrate, (MACROBID) 100 MG capsule, Take 1 capsule (100 mg total) by mouth 2 (two) times daily. (Patient not taking: Reported on 04/02/2017), Disp: 14 capsule, Rfl: 0  The following portions of the patient's history were reviewed and updated as  appropriate: allergies, current medications, past family history, past medical history, past social history, past surgical history and problem list.   Review of Systems:  Pertinent items noted in HPI and remainder of comprehensive ROS otherwise negative.   Objective:  Physical Exam BP 111/76   Pulse 99   Ht 5\' 3"  (1.6 m)   Wt 209 lb (94.8 kg)   LMP 04/22/2019   BMI 37.02 kg/m  CONSTITUTIONAL: Well-developed, well-nourished female in no acute distress.  HENT:  Normocephalic, atraumatic. External right and left ear normal. Oropharynx is clear and moist EYES: Conjunctivae and EOM are normal. Pupils are equal, round, and reactive to light. No scleral icterus.  NECK: Normal range of motion, supple, no masses SKIN: Skin is warm and dry. No rash noted. Not diaphoretic. No erythema. No pallor. NEUROLOGIC: Alert and oriented to person, place, and time. Normal reflexes, muscle tone coordination. No cranial nerve deficit noted. PSYCHIATRIC: Normal mood and affect. Normal behavior. Normal judgment and thought content. CARDIOVASCULAR: Normal heart rate noted RESPIRATORY: Effort normal, no problems with respiration noted ABDOMEN: Soft, no distention noted.  Uterus palpable to umbilicus PELVIC: Normal appearing external genitalia; normal appearing vaginal mucosa and cervix.  No abnormal discharge noted.  pelvic cultures obtained. Enlarged mobile fibroid uterus, extending to umbilicus, diffuse tenderness throughout pelvis, no adenexal masses palpable. MUSCULOSKELETAL: Normal range of motion. No edema noted.  Exam done with chaperone present.  Labs and Imaging No results found.  Assessment & Plan:   1. Abnormal uterine bleeding (AUB) - Reviewed recommendation  is to start with hormonal management of heavy bleeding, need EMB to rule out malignancy, she verbalizes understanding and states she would prefer to proceed directly with surgery given the long time pain she has had. - Reviewed that AUB would  likely not be able to be well controlled with medication given the size of her fibroids/uterus - return for EMB  2. Pelvic pain Likely due to enlarged fibroids  3. Fibroids Enlarged fibroid uterus - TVUS to assess fibroids - [place] [facility] Pelvis Complete with Transvaginal; Future  4. Routine screening for STI (sexually transmitted infection) - Cervicovaginal ancillary only( [place]) - Hepatitis B surface antigen - Hepatitis C Antibody - HIV antibody (with reflex) - RPR  5. Language barrier Patent attorney used    Routine preventative health maintenance measures emphasized. Please refer to After Visit Summary for other counseling recommendations.   Return in about 4 weeks (around 05/30/2019) for Followup, EMB.  Total face-to-face time with patient: 20 minutes. Over 50% of encounter was spent on counseling and coordination of care.  Feliz Beam, M.D. Attending Center for [employer] Fish farm manager)

## 2019-05-02 NOTE — Progress Notes (Signed)
Pt states the only time that she has pain is when she presses on Abdomen or is on her cycle.

## 2019-05-03 LAB — CERVICOVAGINAL ANCILLARY ONLY
Chlamydia: NEGATIVE
Comment: NEGATIVE
Comment: NEGATIVE
Comment: NORMAL
Neisseria Gonorrhea: NEGATIVE
Trichomonas: NEGATIVE

## 2019-05-03 LAB — RPR: RPR Ser Ql: NONREACTIVE

## 2019-05-03 LAB — HIV ANTIBODY (ROUTINE TESTING W REFLEX): HIV Screen 4th Generation wRfx: NONREACTIVE

## 2019-05-03 LAB — HEPATITIS B SURFACE ANTIGEN: Hepatitis B Surface Ag: NEGATIVE

## 2019-05-03 LAB — HEPATITIS C ANTIBODY: Hep C Virus Ab: <0.1 s/co ratio (ref 0.0–0.9)

## 2019-05-09 ENCOUNTER — Telehealth: Payer: Self-pay | Admitting: General Practice

## 2019-05-09 NOTE — Telephone Encounter (Signed)
Patient called and left message on nurse voicemail line stating she needs a nurse to call her back with a [place] interpreter.  Called patient with pacific interpreter [phone], no answer- left message to call us back if you still need assistance.

## 2019-05-10 ENCOUNTER — Ambulatory Visit (HOSPITAL_COMMUNITY)
Admission: RE | Admit: 2019-05-10 | Discharge: 2019-05-10 | Disposition: A | Discharge status: Home / Self Care | Payer: Self-pay | Source: Ambulatory Visit | Attending: Obstetrics and Gynecology | Admitting: Obstetrics and Gynecology

## 2019-05-10 ENCOUNTER — Telehealth: Payer: Self-pay | Admitting: Obstetrics & Gynecology

## 2019-05-10 ENCOUNTER — Other Ambulatory Visit: Payer: Self-pay

## 2019-05-10 DIAGNOSIS — D219 Benign neoplasm of connective and other soft tissue, unspecified: Secondary | ICD-10-CM | POA: Insufficient documentation

## 2019-05-10 NOTE — Telephone Encounter (Signed)
The patient came into the office to discuss complaints of her arm. She stated we drew blood on her last visit here and now it feels like her vein is coming up out of her arm. Its very painful and she would like to speak with a nurse.

## 2019-05-11 ENCOUNTER — Telehealth: Payer: Self-pay

## 2019-05-11 NOTE — Telephone Encounter (Signed)
Called pt using [place] id # [phone], pt had talked with front desk about having issues with her veins after her blood draw on 05/02/2019. Pt states they feel like they are coming out her arm. Spoke with phlebotomist about pt symptoms, she advised pt to adhere warm & cold compressions. I advised if she cont. to have symptoms to call us back & let us know. Pt verbalized understanding.

## 2019-05-30 ENCOUNTER — Encounter (HOSPITAL_COMMUNITY): Payer: Self-pay

## 2019-06-03 ENCOUNTER — Ambulatory Visit (INDEPENDENT_AMBULATORY_CARE_PROVIDER_SITE_OTHER): Payer: Self-pay | Admitting: Obstetrics and Gynecology

## 2019-06-03 ENCOUNTER — Encounter: Payer: Self-pay | Admitting: Obstetrics and Gynecology

## 2019-06-03 ENCOUNTER — Other Ambulatory Visit: Payer: Self-pay

## 2019-06-03 ENCOUNTER — Other Ambulatory Visit (HOSPITAL_COMMUNITY)
Admission: RE | Admit: 2019-06-03 | Discharge: 2019-06-03 | Disposition: A | Discharge status: Home / Self Care | Payer: No Typology Code available for payment source | Source: Ambulatory Visit | Attending: Obstetrics and Gynecology | Admitting: Obstetrics and Gynecology

## 2019-06-03 VITALS — BP 112/71 | HR 74 | Wt 208.3 lb

## 2019-06-03 DIAGNOSIS — N939 Abnormal uterine and vaginal bleeding, unspecified: Secondary | ICD-10-CM

## 2019-06-03 DIAGNOSIS — D250 Submucous leiomyoma of uterus: Secondary | ICD-10-CM

## 2019-06-03 DIAGNOSIS — R102 Pelvic and perineal pain: Secondary | ICD-10-CM

## 2019-06-03 LAB — POCT PREGNANCY, URINE: Preg Test, Ur: NEGATIVE

## 2019-06-03 MED ORDER — NORETHINDRONE ACETATE 5 MG PO TABS: 5.0000 mg | ORAL_TABLET | Freq: Every day | ORAL | 2 refills | Status: DC

## 2019-06-03 NOTE — Addendum Note (Signed)
Addended by: Vivien Rota on: 06/03/2019 12:32 PM   Modules accepted: Orders

## 2019-06-03 NOTE — Progress Notes (Signed)
GYNECOLOGY OFFICE FOLLOW UP NOTE  History:  43 y.o. G1P0010 here today for follow up for AUB and enlarged fibroid uterus. Here for discussion of ultrasound results and next steps. Still having significant bleeding and pain. Denies other complaints at this point, would like to plan for surgery.  No past medical history on file.  Past Surgical History:  Procedure Laterality Date   AUGMENTATION MAMMAPLASTY Bilateral 2004   BREAST SURGERY     SILICON   COSMETIC SURGERY       Current Outpatient Medications:    ibuprofen (ADVIL,MOTRIN) 800 MG tablet, Take 1 tablet (800 mg total) by mouth every 8 (eight) hours as needed., Disp: 30 tablet, Rfl: 5   naproxen (NAPROSYN) 250 MG tablet, Take by mouth 2 (two) times daily with a meal., Disp: , Rfl:    norethindrone (AYGESTIN) 5 MG tablet, Take 1 tablet (5 mg total) by mouth daily., Disp: 30 tablet, Rfl: 2  The following portions of the patient's history were reviewed and updated as appropriate: allergies, current medications, past family history, past medical history, past social history, past surgical history and problem list.   Review of Systems:  Pertinent items noted in HPI and remainder of comprehensive ROS otherwise negative.   Objective:  Physical Exam BP 112/71    Pulse 74    Wt 208 lb 4.8 oz (94.5 kg)    LMP 05/18/2019 (Exact Date)    BMI 36.90 kg/m  CONSTITUTIONAL: Well-developed, well-nourished female in no acute distress.  HENT:  Normocephalic, atraumatic. External right and left ear normal. Oropharynx is clear and moist EYES: Conjunctivae and EOM are normal. Pupils are equal, round, and reactive to light. No scleral icterus.  NECK: Normal range of motion, supple, no masses SKIN: Skin is warm and dry. No rash noted. Not diaphoretic. No erythema. No pallor. NEUROLOGIC: Alert and oriented to person, place, and time. Normal reflexes, muscle tone coordination. No cranial nerve deficit noted. PSYCHIATRIC: Normal mood and  affect. Normal behavior. Normal judgment and thought content. CARDIOVASCULAR: Normal heart rate noted RESPIRATORY: Effort normal, no problems with respiration noted ABDOMEN: Soft, no distention noted.   PELVIC: Normal appearing external genitalia; normal appearing vaginal mucosa and cervix.  No abnormal discharge noted. Enlarged fibroid uterus, mobile, no uterine or adnexal tenderness. MUSCULOSKELETAL: Normal range of motion. No edema noted.  Exam done with chaperone present.  Labs and Imaging [place] [facility] Pelvis Complete with Transvaginal  Result Date: 05/10/2019 CLINICAL DATA:  Uterine fibroids, pelvic pain EXAM: TRANSABDOMINAL AND TRANSVAGINAL ULTRASOUND OF PELVIS TECHNIQUE: Both transabdominal and transvaginal ultrasound examinations of the pelvis were performed. Transabdominal technique was performed for global imaging of the pelvis including uterus, ovaries, adnexal regions, and pelvic cul-de-sac. It was necessary to proceed with endovaginal exam following the transabdominal exam to visualize the endometrium and ovaries. COMPARISON:  None FINDINGS: Uterus Measurements: 14.6 x 12.4 x 12.6 cm = volume: 1184 mL. Anteverted. Significantly enlarged by multiple leiomyomata. Measured leiomyomata include exophytic RIGHT fundal leiomyoma 8.2 x 8.0 x 6.7 cm, central fundal leiomyoma 7.1 x 6.2 x 7.4 cm, and an additional large central upper leiomyoma 7.1 x 5.5 x 7.2 cm. Several leiomyomata are transmural and extend submucosal. Endometrium Not visualized, obscured by multiple uterine masses/leiomyomata Right ovary Not visualized on either transabdominal or endovaginal imaging, likely by a combination of enlarged uterus and bowel gas Left ovary Not visualized on either transabdominal or endovaginal imaging, likely by a combination of enlarged uterus and bowel gas Other findings No free pelvic fluid.  No adnexal masses. IMPRESSION: Significantly enlarged uterus containing multiple uterine leiomyomata measuring up to  8.2 cm in greatest diameter. Nonvisualization of endometrial complex and ovaries due to enlarged nodular uterus and bowel gas. Electronically Signed   By: Lavonia Dana M.D.   On: 05/10/2019 15:30    Assessment & Plan:   1. Abnormal uterine bleeding (AUB) EMB today, see note attached - CBC - will have return to discuss surgical planning - briefly reviewed hysterectomy, would be a TAH, BS, +/- ovaries, would not recommend ovarian removal at this point - may be referred to partner for timing given upcoming leave  2. Submucous leiomyoma of uterus  3. Pelvic pain  Spanish interpretor used for visit today   Routine preventative health maintenance measures emphasized. Please refer to After Visit Summary for other counseling recommendations.   Return in about 3 weeks (around 06/24/2019) for Followup.  Total face-to-face time with patient: 25 minutes. Over 50% of encounter was spent on counseling and coordination of care.  Feliz Beam, M.D. Attending Center for [employer] Fish farm manager)

## 2019-06-03 NOTE — Progress Notes (Signed)
ENDOMETRIAL BIOPSY      Ashley Golden is a 43 y.o. G1P0010 here for endometrial biopsy.  The indications for endometrial biopsy were reviewed.  Risks of the biopsy including cramping, bleeding, infection, uterine perforation, inadequate specimen and need for additional procedures were discussed. The patient states she understands and agrees to undergo procedure today. Consent was signed. Time out was performed.   Indications: AUB Urine HCG: negative  A bivalve speculum was placed into the vagina and the cervix was easily visualized and was prepped with Betadine x2. A single-toothed tenaculum was placed on the anterior lip of the cervix to stabilize it. The 3 mm pipelle was introduced into the endometrial cavity without difficulty to a depth of 11 cm, and a moderate amount of tissue was obtained and sent to pathology. This was repeated for a total of 3 passes. The instruments were removed from the patient's vagina. Minimal bleeding from the cervix at the tenaculum was noted.   The patient tolerated the procedure well. Routine post-procedure instructions were given to the patient.    Will base further management on results of biopsy.  Feliz Beam, M.D. Attending Center for [employer] Fish farm manager)

## 2019-06-04 LAB — CBC
Hematocrit: 28.2 % — ABNORMAL LOW (ref 34.0–46.6)
Hemoglobin: 8.2 g/dL — ABNORMAL LOW (ref 11.1–15.9)
MCH: 19.7 pg — ABNORMAL LOW (ref 26.6–33.0)
MCHC: 29.1 g/dL — ABNORMAL LOW (ref 31.5–35.7)
MCV: 68 fL — ABNORMAL LOW (ref 79–97)
Platelets: 300 10*3/uL (ref 150–450)
RBC: 4.16 x10E6/uL (ref 3.77–5.28)
RDW: 16.5 % — ABNORMAL HIGH (ref 11.7–15.4)
WBC: 7 10*3/uL (ref 3.4–10.8)

## 2019-06-05 MED ORDER — FERROUS SULFATE 325 (65 FE) MG PO TABS: 325.0000 mg | ORAL_TABLET | Freq: Two times a day (BID) | ORAL | 1 refills | Status: DC

## 2019-06-05 NOTE — Addendum Note (Signed)
Addended by: Vivien Rota on: 06/05/2019 10:17 PM   Modules accepted: Orders

## 2019-06-06 ENCOUNTER — Telehealth: Payer: Self-pay

## 2019-06-06 LAB — SURGICAL PATHOLOGY

## 2019-06-06 NOTE — Telephone Encounter (Signed)
Called pt using [place] id# [phone], to advise pt of test results & Rx sent to Pharmacy on file. No answer, left VM for pt to pick up Iron Rx from Pharmacy.

## 2019-06-06 NOTE — Telephone Encounter (Signed)
-----   Message from Sloan Leiter, MD sent at 06/05/2019 10:17 PM EST ----- Anemia, please call and start patient on iron as prescribed, she is Spanish speaking

## 2019-06-27 ENCOUNTER — Ambulatory Visit: Payer: No Typology Code available for payment source | Admitting: Obstetrics & Gynecology

## 2019-06-29 ENCOUNTER — Ambulatory Visit: Payer: No Typology Code available for payment source | Admitting: Obstetrics and Gynecology

## 2019-06-30 ENCOUNTER — Other Ambulatory Visit: Payer: Self-pay

## 2019-06-30 ENCOUNTER — Encounter: Payer: Self-pay | Admitting: Obstetrics & Gynecology

## 2019-06-30 ENCOUNTER — Ambulatory Visit (INDEPENDENT_AMBULATORY_CARE_PROVIDER_SITE_OTHER): Payer: Self-pay | Admitting: Obstetrics & Gynecology

## 2019-06-30 VITALS — BP 117/79 | HR 82 | Wt 207.7 lb

## 2019-06-30 DIAGNOSIS — N946 Dysmenorrhea, unspecified: Secondary | ICD-10-CM

## 2019-06-30 DIAGNOSIS — D251 Intramural leiomyoma of uterus: Secondary | ICD-10-CM

## 2019-06-30 MED ORDER — MEDROXYPROGESTERONE ACETATE 10 MG PO TABS: 10.0000 mg | ORAL_TABLET | Freq: Every day | ORAL | 2 refills | Status: DC

## 2019-06-30 MED ORDER — LEUPROLIDE ACETATE (3 MONTH) 11.25 MG IM KIT: 11.2500 mg | PACK | Freq: Once | INTRAMUSCULAR | Status: DC

## 2019-06-30 NOTE — Patient Instructions (Signed)
Informacin sobre la histerectoma Hysterectomy Information  La histerectoma es una ciruga que se realiza para extirpar el tero. Despus de la ciruga, no volver a Personal assistant. Adems, no podr quedar embarazada. Motivos para Nutritional therapist ciruga Podran hacerle esta ciruga en los siguientes casos:  Tiene sangrado de la vagina: ? Que no es normal. ? Que no desaparece o no para.  Tiene dolor durante largo tiempo (crnico) en la parte baja del vientre (zona plvica).  El tejido que reviste internamente al tero se desarrolla fuera del mismo (endometriosis).  El tejido que reviste internamente el tero crece en el msculo del tero (adenomiosis).  El tero cae hacia la vagina (prolapso).  Tiene un bulto en el tero que le causa problemas (fibromas uterinos).  Presenta clulas que podran convertirse en cncer (clulas precancerosas).  Tiene cncer de tero o de cuello. Tipos de histerectoma Hay 3 tipos de histerectoma. Segn el tipo, en la ciruga:  Se extirpar solo la parte superior del tero (supracervical).  Se extirpar el tero y el cuello uterino (total).  Se extirpar el tero, el cuello uterino y el tejido que sostiene el tero en su lugar (radical). Formas en que se puede realizar una histerectoma Esta ciruga se puede realizar de [place] de las siguientes maneras:  Se realiza un corte (incisin) en el vientre (abdomen). A travs del corte se extrae el tero.  Se hace una incisin en la vagina. A travs del corte se extrae el tero.  Se hacen tres o cuatro incisiones en el abdomen. A travs de [employer] cortes, se coloca un dispositivo con Carlota Raspberry. El tero se corta en partes y se extrae a travs de unos cortes realizados en la vagina.  Se hacen tres o cuatro incisiones en el abdomen. A travs de [employer] cortes, se coloca un dispositivo con Carlota Raspberry. El tero se extrae a travs de la vagina.  Se hacen tres o cuatro incisiones en el  abdomen. Una computadora permite controlar el instrumental quirrgico. El tero se corta en trozos pequeos. Las secciones se retiran a travs de los cortes o por la vagina. Hable con su mdico acerca de la manera ms adecuada para usted. Riesgos de [employer] Por lo general, esta ciruga es segura. Sin embargo, pueden presentarse problemas, por ejemplo:  Hemorragia.  Recibir Tree surgeon (transfusin).  Cogulos de [place].  Infeccin.  Daos a Catering manager u otros rganos.  Reacciones alrgicas.  Necesitar otro tipo de [place]. Qu esperar despus de la ciruga  Le darn medicamentos para Conservation officer, historic buildings.  Ser necesario que Music therapist hospital [place] 1 y 2 das.  Siga las indicaciones de su mdico respecto de lo siguiente: ? Psychologist, occupational fsica. ? Conducir. ? Nani Gasser son seguras para usted.  Necesitar que alguien permanezca con usted en su casa durante 3 a 5das.  Tendr que ver al mdico despus de 2 a 4semanas.  Probablemente tenga acaloramientos, sudoracin nocturna y dificultades para dormir.  Es posible que tenga que hacerse un Papanicolaou si la causa de la ciruga estaba relacionada con cncer. Hable con el mdico sobre la frecuencia con la que debe hacerse el Papanicolaou. Preguntas para hacerle al mdico  Necesito hacerme esta ciruga? Necesito otras opciones de tratamiento?  Cules son mis opciones para esta ciruga?  Qu se debe extirpar?  Cules son los riesgos?  Cules son los beneficios?  Cunto Audiological scientist hospital?  Cunto tiempo llevar la recuperacin?  Qu sntomas pueden  aparecer despus del procedimiento? Resumen  La histerectoma es una ciruga que se realiza para extirpar el tero. Despus de la ciruga, no volver a Personal assistant. Adems, no podr quedar embarazada.  Hable con su mdico acerca de cul es el mejor tipo de histerectoma para usted. Esta informacin no  tiene Marine scientist el consejo del mdico. Asegrese de hacerle al mdico cualquier pregunta que tenga. Document Revised: 02/03/2017 Document Reviewed: 02/03/2017 Elsevier Patient Education  [employer].

## 2019-06-30 NOTE — Progress Notes (Signed)
44 y.o. G1P0010 Patient's last menstrual period was 06/13/2019 (exact date). Large fibroids, here to review test results and discuss surgical management  No past medical history on file. Past Surgical History:  Procedure Laterality Date  . AUGMENTATION MAMMAPLASTY Bilateral 2004  . BREAST SURGERY     SILICON  . COSMETIC SURGERY       Patient desires surgical management with TAH/BS.  The risks of surgery were discussed in detail with the patient including but not limited to: bleeding which may require transfusion or reoperation; infection which may require prolonged hospitalization or re-hospitalization and antibiotic therapy; injury to bowel, bladder, ureters and major vessels or other surrounding organs; need for additional procedures including laparotomy; thromboembolic phenomenon, incisional problems and other postoperative or anesthesia complications.  Patient was told that the likelihood that her condition and symptoms will be treated effectively with this surgical management was very high; the postoperative expectations were also discussed in detail. The patient also understands the alternative treatment options which were discussed in full. All questions were answered.  She was told that she will be contacted by our surgical scheduler regarding the time and date of her surgery; routine preoperative instructions will be given to her by the preoperative nursing team.   She is aware of need for preoperative COVID testing and subsequent quarantine from time of test to time of surgery; she will be given further preoperative instructions at that [place] screening visit. Printed patient education handouts about the procedure were given to the patient to review at home. She will receive Lupron and take Provera prior to surgery. No consent for blood products. Pathology nd pap benign.  Woodroe Mode, MD 06/30/2019

## 2019-07-05 ENCOUNTER — Telehealth: Payer: Self-pay

## 2019-07-05 NOTE — Telephone Encounter (Addendum)
-----   Message from Ashley Golden sent at 07/05/2019  7:20 AM EST ----- Regarding: Needs Hysterectomy Statement Needs Hysterectomy statement, surgery 02/23   Pt called with [facility] interpreter Angela Nevin [phone]. VM left by interpreter asking patient to come by the office to sign paperwork. Encouraged pt to call or send a MyChart message with questions. MyChart message sent to patient with more information.

## 2019-07-06 NOTE — Telephone Encounter (Signed)
Called pt with interpreter Eda. Explained to pt she will need to come into the front office to sign a hysterectomy statement prior to surgery. Pt states she will come Friday AM. Pt inquired about date of surgery; date of surgery given and explained an official letter has been mailed. Pt states she attempted to pick up iron tablets and was unable to; explained to pt I will make sure they are available at her pharmacy for pick up. Pt also concerned about status of Lupron injection. She states she returned financial paperwork for Lupron injection yesterday. I explained the plans have not changed and we will notify her when we have her injection.  Called [facility]. Spoke with staff member who states they will prepare iron tablets for pick up.

## 2019-07-13 ENCOUNTER — Ambulatory Visit: Payer: No Typology Code available for payment source | Admitting: Obstetrics and Gynecology

## 2019-07-19 ENCOUNTER — Other Ambulatory Visit: Payer: Self-pay | Admitting: Obstetrics & Gynecology

## 2019-07-19 NOTE — Progress Notes (Signed)
Orders for surgery 

## 2019-07-26 ENCOUNTER — Encounter: Payer: Self-pay | Admitting: Lactation Services

## 2019-07-26 NOTE — Progress Notes (Signed)
Hysterectomy Statement signed by Patient.   Pt also brought documentation that she would not like to have any Blood- Copy Attached to Hysterectomy Statement and informed pt to take her original document on the day of surgery. Pt voiced understanding.

## 2019-07-27 ENCOUNTER — Other Ambulatory Visit: Payer: Self-pay

## 2019-07-27 ENCOUNTER — Encounter: Payer: Self-pay | Admitting: Internal Medicine

## 2019-07-27 ENCOUNTER — Ambulatory Visit (INDEPENDENT_AMBULATORY_CARE_PROVIDER_SITE_OTHER): Payer: No Typology Code available for payment source | Admitting: Internal Medicine

## 2019-07-27 VITALS — BP 113/82 | HR 84 | Temp 98.3°F | Ht 62.0 in | Wt 208.2 lb

## 2019-07-27 DIAGNOSIS — R7303 Prediabetes: Secondary | ICD-10-CM

## 2019-07-27 DIAGNOSIS — R05 Cough: Secondary | ICD-10-CM | POA: Insufficient documentation

## 2019-07-27 DIAGNOSIS — R0982 Postnasal drip: Secondary | ICD-10-CM

## 2019-07-27 DIAGNOSIS — R059 Cough, unspecified: Secondary | ICD-10-CM | POA: Insufficient documentation

## 2019-07-27 LAB — POCT GLYCOSYLATED HEMOGLOBIN (HGB A1C): Hemoglobin A1C: 5.3 % (ref 4.0–5.6)

## 2019-07-27 LAB — GLUCOSE, CAPILLARY: Glucose-Capillary: 119 mg/dL — ABNORMAL HIGH (ref 70–99)

## 2019-07-27 MED ORDER — FLUTICASONE PROPIONATE 50 MCG/ACT NA SUSP: NASAL | 1 refills | Status: AC

## 2019-07-27 NOTE — Assessment & Plan Note (Signed)
Patient here for follow up of prediabetes. Hemoglobin A1c 5.3 Encouraged to continue health diet and exercise routine.

## 2019-07-27 NOTE — Patient Instructions (Addendum)
Thank you for trusting me with your care. To recap, today we discussed the following:   Post nasal drip - Use fluticasone spray - If you do not improve schedule an appointment for further evaluation  Your A1c is 5.3 today. You do not have diabetes.     My best , Tamsen Snider , MD

## 2019-07-27 NOTE — Assessment & Plan Note (Signed)
Patient presents today for evaluation of cough.  She reports the cough started 2 months ago.  Last year around this time she also had cough and a runny nose.  She denies any fever or chills, sinus pain, or productive cough.  The cough is dry and is caused her chest pain and back pain twice during the 2 months.  She says the cough is worse at night, but experiences this while sitting up and it does not get worse when laying down.  Assessment: Postnasal drip  It is likely patient has postnasal drip.  She does not smoke.  Considered GERD, however patient usually experiences the symptoms while sitting up and associates it with a season of the year.   If cough worsens or continues we will consider treating for GERD and getting chest x-ray.   Plan: -Continue Zyrtec - Fluticasone 2 sprays per nostril for 3 days,  followed by 1 spray per nostril each day

## 2019-07-27 NOTE — Progress Notes (Addendum)
   CC: Cough and pre-diabetes   HPI:[name] is a 44 y.o. female who presents for evaluation of cough. Please see individual problem based A/P for details.   No past medical history on file. Review of Systems:  ROS negative except as per HPI.  Physical Exam: Vitals:   07/27/19 1621  BP: 113/82  Pulse: 84  Temp: 98.3 F (36.8 C)  TempSrc: Oral  SpO2: 97%  Weight: 208 lb 3.2 oz (94.4 kg)  Height: 5\' 2"  (1.575 m)    General: Alert, nl appearance, obese , NAD H: Normocephalic, atraumatic.  E: Left and Right TM normal, Left and right canal normal E: PEERL, EOMI, Conjunctivae normal N: No congestion, no rhinorrhea, mild erythema T: moist, no exudate or erythema  Cardiovascular: Normal rate, regular rhythm.  No murmurs, rubs, or gallops Pulmonary : Effort normal, breath sounds normal. No wheezes, rales, or rhonchi Abdominal: soft, nontender, no mass, no distention, no guarding, no rebound,  bowel sounds normal  Assessment & Plan:   See Encounters Tab for problem based charting.  Patient discussed with Dr. Lynnae January

## 2019-07-28 ENCOUNTER — Encounter: Payer: Self-pay | Admitting: *Deleted

## 2019-07-28 NOTE — Progress Notes (Signed)
Internal Medicine Clinic Attending ° °Case discussed with Dr. Steen  at the time of the visit.  We reviewed the resident’s history and exam and pertinent patient test results.  I agree with the assessment, diagnosis, and plan of care documented in the resident’s note.  °

## 2019-08-03 NOTE — Patient Instructions (Signed)
Get your Covid test at [place]. On 2/19 at 2:05   Ashley Golden       Your procedure is scheduled on 08/16/19   Report to [facility]  at 11:00 A.M.   Call this number if you have problems the morning of surgery:[phone]   OUR ADDRESS IS [address], WE ARE [facility]ATED [facility] WITH [facility].   Remember:  Do not eat food after midnight.     You may have Clear Liquids until 10:00 AM   CLEAR LIQUID DIET   Foods Allowed                                                                     Foods Excluded  Coffee and tea, regular and decaf                             liquids that you cannot  Plain Jell-O any favor except red or purple                                           see through such as: Fruit ices (not with fruit pulp)                                     milk, soups, orange juice  Iced Popsicles                                    All solid food Carbonated beverages, regular and diet                                    Cranberry, grape and apple juices Sports drinks like Gatorade Lightly seasoned clear broth or consume(fat free) Sugar, honey syrup  Sample Menu Breakfast                                Lunch                                     Cranberry juice                    Beef broth                             Jell-O                                     Grape juice  Coffee or tea                        Jell-O   Coffee or tea                          _____________________________________________________________________  Nothing by mouth after 10:00 AM   Take these medicines the morning of surgery with A SIP OF WATER: Zyrtec, Flonase   Do not wear jewelry, make-up or nail polish.  Do not wear lotions, powders, or perfumes, or deoderant.  Do not shave 48 hours prior to surgery.    Do not bring valuables to the hospital.  [facility] is not responsible for any belongings or  valuables.  Contacts, dentures or bridgework may not be worn into surgery.   For patients admitted to the hospital, discharge time will be determined by your treatment team.  Patients discharged the day of surgery will not be allowed to drive home.   Special instructions:    Please read over the following fact sheets that you were given:  [facility] - Preparing for Surgery  Before surgery, you can play an important role.   Because skin is not sterile, your skin needs to be as free of germs as possible .  You can reduce the number of germs on your skin by washing with CHG (chlorahexidine gluconate) soap before surgery.   CHG is an antiseptic cleaner which kills germs and bonds with the skin to continue killing germs even after washing. Please DO NOT use if you have an allergy to CHG or antibacterial soaps.   If your skin becomes reddened/irritated stop using the CHG and inform your nurse when you arrive at Short Stay. Do not shave (including legs and underarms) for at least 48 hours prior to the first CHG shower.  Please follow these instructions carefully:  1.  Shower with CHG Soap the night before surgery and the  morning of Surgery.  2.  If you choose to wash your hair, wash your hair first as usual with your  normal  shampoo.  3.  After you shampoo, rinse your hair and body thoroughly to remove the  shampoo.                                        4.  Use CHG as you would any other liquid soap.  You can apply chg directly  to the skin and wash                       Gently with a scrungie or clean washcloth.  5.  Apply the CHG Soap to your body ONLY FROM THE NECK DOWN.   Do not use on face/ open                           Wound or open sores. Avoid contact with eyes, ears mouth and genitals (private parts).                       Wash face,  Genitals (private parts) with your normal soap.             6.  Wash thoroughly, paying special attention to the area where your surgery  will be  performed.  7.  Thoroughly rinse your body with warm water from the neck down.  8.  DO NOT shower/wash with your normal soap after using and rinsing off  the CHG Soap.             9.  Pat yourself dry with a clean towel.            10.  Wear clean pajamas.            11.  Place clean sheets on your bed the night of your first shower and do not  sleep with pets. Day of Surgery : Do not apply any lotions/deodorants the morning of surgery.  Please wear clean clothes to the hospital/surgery center.  FAILURE TO FOLLOW THESE INSTRUCTIONS MAY RESULT IN THE CANCELLATION OF YOUR SURGERY PATIENT SIGNATURE_________________________________  NURSE SIGNATURE__________________________________  ________________________________________________________________________

## 2019-08-04 ENCOUNTER — Other Ambulatory Visit: Payer: Self-pay

## 2019-08-04 ENCOUNTER — Encounter (HOSPITAL_COMMUNITY)
Admission: RE | Admit: 2019-08-04 | Discharge: 2019-08-04 | Disposition: A | Discharge status: Home / Self Care | Payer: No Typology Code available for payment source | Source: Ambulatory Visit | Attending: Obstetrics & Gynecology | Admitting: Obstetrics & Gynecology

## 2019-08-04 ENCOUNTER — Encounter (HOSPITAL_COMMUNITY): Payer: Self-pay

## 2019-08-04 DIAGNOSIS — Z01818 Encounter for other preprocedural examination: Secondary | ICD-10-CM | POA: Insufficient documentation

## 2019-08-04 HISTORY — DX: Prediabetes: R73.03

## 2019-08-04 NOTE — Progress Notes (Signed)
PCP - Madalyn Rob. LOV: 07/27/19. EPIC Cardiologist -   Chest x-ray -  EKG -  Stress Test -  ECHO -  Cardiac Cath -   A!C: 07/27/19. EPIC Sleep Study -  CPAP -   Fasting Blood Sugar -  Checks Blood Sugar _____ times a day  Blood Thinner Instructions: Aspirin Instructions: Last Dose:  Anesthesia review:   Patient denies shortness of breath, fever, cough and chest pain at PAT appointment   Patient verbalized understanding of instructions that were given to them at the PAT appointment. Patient was also instructed that they will need to review over the PAT instructions again at home before surgery.

## 2019-08-10 ENCOUNTER — Encounter (HOSPITAL_COMMUNITY)
Admission: RE | Admit: 2019-08-10 | Discharge: 2019-08-10 | Disposition: A | Discharge status: Home / Self Care | Payer: No Typology Code available for payment source | Source: Ambulatory Visit | Attending: Obstetrics & Gynecology | Admitting: Obstetrics & Gynecology

## 2019-08-10 ENCOUNTER — Other Ambulatory Visit: Payer: Self-pay

## 2019-08-10 DIAGNOSIS — Z01812 Encounter for preprocedural laboratory examination: Secondary | ICD-10-CM | POA: Insufficient documentation

## 2019-08-10 LAB — CBC
HCT: 37.2 % (ref 36.0–46.0)
Hemoglobin: 10.8 g/dL — ABNORMAL LOW (ref 12.0–15.0)
MCH: 23 pg — ABNORMAL LOW (ref 26.0–34.0)
MCHC: 29 g/dL — ABNORMAL LOW (ref 30.0–36.0)
MCV: 79.1 fL — ABNORMAL LOW (ref 80.0–100.0)
Platelets: 263 10*3/uL (ref 150–400)
RBC: 4.7 MIL/uL (ref 3.87–5.11)
RDW: 25.8 % — ABNORMAL HIGH (ref 11.5–15.5)
WBC: 6.9 10*3/uL (ref 4.0–10.5)
nRBC: 0 % (ref 0.0–0.2)

## 2019-08-12 ENCOUNTER — Other Ambulatory Visit (HOSPITAL_COMMUNITY)
Admission: RE | Admit: 2019-08-12 | Discharge: 2019-08-12 | Disposition: A | Discharge status: Home / Self Care | Payer: HRSA Program | Source: Ambulatory Visit | Attending: Obstetrics & Gynecology | Admitting: Obstetrics & Gynecology

## 2019-08-12 DIAGNOSIS — Z01812 Encounter for preprocedural laboratory examination: Secondary | ICD-10-CM | POA: Insufficient documentation

## 2019-08-12 DIAGNOSIS — Z20822 Contact with and (suspected) exposure to covid-19: Secondary | ICD-10-CM | POA: Diagnosis not present

## 2019-08-12 LAB — SARS CORONAVIRUS 2 (TAT 6-24 HRS): SARS Coronavirus 2: NEGATIVE

## 2019-08-15 ENCOUNTER — Telehealth: Payer: Self-pay | Admitting: *Deleted

## 2019-08-15 NOTE — Telephone Encounter (Signed)
Pt left message stating that she is scheduled for surgery tomorrow. She now has her period and wants to talk with a nurse to see if she can still have surgery. I returned pt's call w/interpreter [phone] following consult w/Dr Roselie Awkward. Message was left on pt's personal voicemail stating that her surgery can take place as scheduled. She should follow any instructions previously given in regards to when to stop eating and when to arrive at the hospital.

## 2019-08-16 ENCOUNTER — Encounter (HOSPITAL_COMMUNITY)
Admission: RE | Disposition: A | Discharge status: Home / Self Care | Payer: Self-pay | Source: Home / Self Care | Attending: Obstetrics & Gynecology

## 2019-08-16 ENCOUNTER — Inpatient Hospital Stay (HOSPITAL_BASED_OUTPATIENT_CLINIC_OR_DEPARTMENT_OTHER)
Admission: RE | Admit: 2019-08-16 | Discharge: 2019-08-18 | DRG: 743 | Disposition: A | Discharge status: Home / Self Care | Payer: Self-pay | Attending: Obstetrics & Gynecology | Admitting: Obstetrics & Gynecology

## 2019-08-16 ENCOUNTER — Inpatient Hospital Stay (HOSPITAL_BASED_OUTPATIENT_CLINIC_OR_DEPARTMENT_OTHER): Payer: Self-pay | Admitting: Anesthesiology

## 2019-08-16 ENCOUNTER — Encounter (HOSPITAL_BASED_OUTPATIENT_CLINIC_OR_DEPARTMENT_OTHER): Payer: Self-pay | Admitting: Obstetrics & Gynecology

## 2019-08-16 ENCOUNTER — Inpatient Hospital Stay (HOSPITAL_BASED_OUTPATIENT_CLINIC_OR_DEPARTMENT_OTHER): Payer: Self-pay | Admitting: Physician Assistant

## 2019-08-16 ENCOUNTER — Other Ambulatory Visit: Payer: Self-pay

## 2019-08-16 DIAGNOSIS — D259 Leiomyoma of uterus, unspecified: Principal | ICD-10-CM | POA: Diagnosis present

## 2019-08-16 DIAGNOSIS — D251 Intramural leiomyoma of uterus: Secondary | ICD-10-CM

## 2019-08-16 DIAGNOSIS — IMO0001 Reserved for inherently not codable concepts without codable children: Secondary | ICD-10-CM

## 2019-08-16 DIAGNOSIS — N939 Abnormal uterine and vaginal bleeding, unspecified: Secondary | ICD-10-CM | POA: Diagnosis present

## 2019-08-16 DIAGNOSIS — D250 Submucous leiomyoma of uterus: Secondary | ICD-10-CM

## 2019-08-16 DIAGNOSIS — Z531 Procedure and treatment not carried out because of patient's decision for reasons of belief and group pressure: Secondary | ICD-10-CM

## 2019-08-16 HISTORY — PX: HYSTERECTOMY ABDOMINAL WITH SALPINGECTOMY: SHX6725

## 2019-08-16 LAB — TYPE AND SCREEN
ABO/RH(D): O POS
Antibody Screen: NEGATIVE

## 2019-08-16 LAB — ABO/RH: ABO/RH(D): O POS

## 2019-08-16 LAB — NO BLOOD PRODUCTS

## 2019-08-16 SURGERY — HYSTERECTOMY, TOTAL, ABDOMINAL, WITH SALPINGECTOMY
Anesthesia: General | Site: Abdomen | Laterality: Bilateral

## 2019-08-16 MED ORDER — LIP MEDEX EX OINT
TOPICAL_OINTMENT | CUTANEOUS | Status: AC
  Filled 2019-08-16: qty 7

## 2019-08-16 MED ORDER — KETOROLAC TROMETHAMINE 30 MG/ML IJ SOLN
INTRAMUSCULAR | Status: AC
  Filled 2019-08-16: qty 1

## 2019-08-16 MED ORDER — DEXAMETHASONE SODIUM PHOSPHATE 10 MG/ML IJ SOLN
INTRAMUSCULAR | Status: DC | PRN
  Administered 2019-08-16: 10 mg via INTRAVENOUS

## 2019-08-16 MED ORDER — FENTANYL CITRATE (PF) 250 MCG/5ML IJ SOLN
INTRAMUSCULAR | Status: DC | PRN
  Administered 2019-08-16 (×5): 50 ug via INTRAVENOUS

## 2019-08-16 MED ORDER — CEFAZOLIN SODIUM-DEXTROSE 2-4 GM/100ML-% IV SOLN
2.0000 g | INTRAVENOUS | Status: AC
  Administered 2019-08-16: 13:00:00 2 g via INTRAVENOUS
  Filled 2019-08-16: qty 100

## 2019-08-16 MED ORDER — GLYCOPYRROLATE PF 0.2 MG/ML IJ SOSY
PREFILLED_SYRINGE | INTRAMUSCULAR | Status: AC
  Filled 2019-08-16: qty 1

## 2019-08-16 MED ORDER — SUGAMMADEX SODIUM 200 MG/2ML IV SOLN
INTRAVENOUS | Status: DC | PRN
  Administered 2019-08-16: 200 mg via INTRAVENOUS

## 2019-08-16 MED ORDER — LIDOCAINE 2% (20 MG/ML) 5 ML SYRINGE
INTRAMUSCULAR | Status: DC | PRN
  Administered 2019-08-16: 75 mg via INTRAVENOUS

## 2019-08-16 MED ORDER — ONDANSETRON HCL 4 MG/2ML IJ SOLN
INTRAMUSCULAR | Status: AC
  Filled 2019-08-16: qty 2

## 2019-08-16 MED ORDER — ROCURONIUM BROMIDE 10 MG/ML (PF) SYRINGE
PREFILLED_SYRINGE | INTRAVENOUS | Status: DC | PRN
  Administered 2019-08-16: 20 mg via INTRAVENOUS
  Administered 2019-08-16: 60 mg via INTRAVENOUS
  Administered 2019-08-16: 10 mg via INTRAVENOUS

## 2019-08-16 MED ORDER — KETAMINE HCL 10 MG/ML IJ SOLN
INTRAMUSCULAR | Status: AC
  Filled 2019-08-16: qty 1

## 2019-08-16 MED ORDER — MIDAZOLAM HCL 2 MG/2ML IJ SOLN
INTRAMUSCULAR | Status: DC | PRN
  Administered 2019-08-16 (×2): 1 mg via INTRAVENOUS

## 2019-08-16 MED ORDER — SCOPOLAMINE 1 MG/3DAYS TD PT72
MEDICATED_PATCH | TRANSDERMAL | Status: AC
  Filled 2019-08-16: qty 1

## 2019-08-16 MED ORDER — HYDROMORPHONE HCL 1 MG/ML IJ SOLN
INTRAMUSCULAR | Status: AC
  Filled 2019-08-16: qty 1

## 2019-08-16 MED ORDER — PROPOFOL 10 MG/ML IV BOLUS
INTRAVENOUS | Status: DC | PRN
  Administered 2019-08-16: 150 mg via INTRAVENOUS

## 2019-08-16 MED ORDER — HYDROMORPHONE HCL 1 MG/ML IJ SOLN
1.0000 mg | INTRAMUSCULAR | Status: DC | PRN
  Administered 2019-08-16 (×2): 1 mg via INTRAVENOUS
  Filled 2019-08-16 (×2): qty 1

## 2019-08-16 MED ORDER — CEFAZOLIN SODIUM-DEXTROSE 2-4 GM/100ML-% IV SOLN
INTRAVENOUS | Status: AC
  Filled 2019-08-16: qty 100

## 2019-08-16 MED ORDER — OXYCODONE HCL 5 MG PO TABS
5.0000 mg | ORAL_TABLET | Freq: Once | ORAL | Status: DC | PRN
  Filled 2019-08-16: qty 1

## 2019-08-16 MED ORDER — KETAMINE HCL 10 MG/ML IJ SOLN
INTRAMUSCULAR | Status: DC | PRN
  Administered 2019-08-16: 25 mg via INTRAVENOUS

## 2019-08-16 MED ORDER — TRANEXAMIC ACID-NACL 1000-0.7 MG/100ML-% IV SOLN
INTRAVENOUS | Status: AC
  Filled 2019-08-16: qty 100

## 2019-08-16 MED ORDER — ACETAMINOPHEN 10 MG/ML IV SOLN
1000.0000 mg | Freq: Once | INTRAVENOUS | Status: DC | PRN
  Filled 2019-08-16: qty 100

## 2019-08-16 MED ORDER — GLYCOPYRROLATE PF 0.2 MG/ML IJ SOSY
PREFILLED_SYRINGE | INTRAMUSCULAR | Status: DC | PRN
  Administered 2019-08-16: .2 mg via INTRAVENOUS

## 2019-08-16 MED ORDER — LACTATED RINGERS IV SOLN: INTRAVENOUS | Status: DC

## 2019-08-16 MED ORDER — LACTATED RINGERS IV SOLN
INTRAVENOUS | Status: DC
  Administered 2019-08-16: 12:00:00 1000 mL via INTRAVENOUS
  Filled 2019-08-16: qty 1000

## 2019-08-16 MED ORDER — MIDAZOLAM HCL 2 MG/2ML IJ SOLN
INTRAMUSCULAR | Status: AC
  Filled 2019-08-16: qty 2

## 2019-08-16 MED ORDER — KETOROLAC TROMETHAMINE 30 MG/ML IJ SOLN
30.0000 mg | Freq: Four times a day (QID) | INTRAMUSCULAR | Status: DC
  Administered 2019-08-16 – 2019-08-18 (×6): 30 mg via INTRAVENOUS
  Filled 2019-08-16 (×6): qty 1

## 2019-08-16 MED ORDER — ROCURONIUM BROMIDE 10 MG/ML (PF) SYRINGE
PREFILLED_SYRINGE | INTRAVENOUS | Status: AC
  Filled 2019-08-16: qty 10

## 2019-08-16 MED ORDER — LIDOCAINE 2% (20 MG/ML) 5 ML SYRINGE
INTRAMUSCULAR | Status: AC
  Filled 2019-08-16: qty 5

## 2019-08-16 MED ORDER — ONDANSETRON HCL 4 MG/2ML IJ SOLN: 4.0000 mg | Freq: Four times a day (QID) | INTRAMUSCULAR | Status: DC | PRN

## 2019-08-16 MED ORDER — FENTANYL CITRATE (PF) 250 MCG/5ML IJ SOLN
INTRAMUSCULAR | Status: AC
  Filled 2019-08-16: qty 5

## 2019-08-16 MED ORDER — DEXAMETHASONE SODIUM PHOSPHATE 10 MG/ML IJ SOLN
INTRAMUSCULAR | Status: AC
  Filled 2019-08-16: qty 1

## 2019-08-16 MED ORDER — ZOLPIDEM TARTRATE 5 MG PO TABS: 5.0000 mg | ORAL_TABLET | Freq: Every evening | ORAL | Status: DC | PRN

## 2019-08-16 MED ORDER — PROMETHAZINE HCL 25 MG/ML IJ SOLN
6.2500 mg | INTRAMUSCULAR | Status: DC | PRN
  Filled 2019-08-16: qty 1

## 2019-08-16 MED ORDER — ONDANSETRON HCL 4 MG PO TABS: 4.0000 mg | ORAL_TABLET | Freq: Four times a day (QID) | ORAL | Status: DC | PRN

## 2019-08-16 MED ORDER — TRANEXAMIC ACID-NACL 1000-0.7 MG/100ML-% IV SOLN
1000.0000 mg | INTRAVENOUS | Status: AC
  Administered 2019-08-16: 13:00:00 1000 mg via INTRAVENOUS
  Filled 2019-08-16: qty 100

## 2019-08-16 MED ORDER — OXYCODONE-ACETAMINOPHEN 5-325 MG PO TABS
1.0000 | ORAL_TABLET | ORAL | Status: DC | PRN
  Administered 2019-08-17 (×2): 2 via ORAL
  Administered 2019-08-17: 22:00:00 1 via ORAL
  Administered 2019-08-17: 2 via ORAL
  Administered 2019-08-18: 1 via ORAL
  Filled 2019-08-16 (×3): qty 2
  Filled 2019-08-16: qty 1
  Filled 2019-08-16: qty 2

## 2019-08-16 MED ORDER — KETOROLAC TROMETHAMINE 30 MG/ML IJ SOLN
30.0000 mg | Freq: Once | INTRAMUSCULAR | Status: AC
  Administered 2019-08-16: 30 mg via INTRAVENOUS
  Filled 2019-08-16: qty 1

## 2019-08-16 MED ORDER — ONDANSETRON HCL 4 MG/2ML IJ SOLN
INTRAMUSCULAR | Status: DC | PRN
  Administered 2019-08-16: 4 mg via INTRAVENOUS

## 2019-08-16 MED ORDER — SCOPOLAMINE 1 MG/3DAYS TD PT72
1.0000 | MEDICATED_PATCH | TRANSDERMAL | Status: DC
  Administered 2019-08-16: 12:00:00 1.5 mg via TRANSDERMAL
  Filled 2019-08-16: qty 1

## 2019-08-16 MED ORDER — BUPIVACAINE HCL (PF) 0.25 % IJ SOLN
INTRAMUSCULAR | Status: DC | PRN
  Administered 2019-08-16: 60 mL

## 2019-08-16 MED ORDER — HYDROMORPHONE HCL 1 MG/ML IJ SOLN
0.2500 mg | INTRAMUSCULAR | Status: DC | PRN
  Administered 2019-08-16 (×4): 0.5 mg via INTRAVENOUS
  Filled 2019-08-16: qty 0.5

## 2019-08-16 MED ORDER — OXYCODONE HCL 5 MG/5ML PO SOLN
5.0000 mg | Freq: Once | ORAL | Status: DC | PRN
  Filled 2019-08-16: qty 5

## 2019-08-16 MED ORDER — LIDOCAINE 2% (20 MG/ML) 5 ML SYRINGE
INTRAMUSCULAR | Status: AC
  Filled 2019-08-16: qty 10

## 2019-08-16 MED ORDER — PROPOFOL 10 MG/ML IV BOLUS
INTRAVENOUS | Status: AC
  Filled 2019-08-16: qty 20

## 2019-08-16 SURGICAL SUPPLY — 39 items
BINDER ABDOMINAL 12 ML 46-62 (SOFTGOODS) ×1 IMPLANT
BINDER ABDOMINAL 12 SM 30-45 (SOFTGOODS) ×1 IMPLANT
CANISTER SUCT 3000ML PPV (MISCELLANEOUS) ×4 IMPLANT
COVER WAND RF STERILE (DRAPES) ×2 IMPLANT
DRAPE WARM FLUID 44X44 (DRAPES) ×1 IMPLANT
DRSG OPSITE 11X17.75 LRG (GAUZE/BANDAGES/DRESSINGS) ×1 IMPLANT
DRSG OPSITE POSTOP 4X10 (GAUZE/BANDAGES/DRESSINGS) ×2 IMPLANT
DURAPREP 26ML APPLICATOR (WOUND CARE) ×2 IMPLANT
GAUZE 4X4 16PLY RFD (DISPOSABLE) ×1 IMPLANT
GAUZE SPONGE 4X4 12PLY STRL LF (GAUZE/BANDAGES/DRESSINGS) ×1 IMPLANT
GLOVE BIO SURGEON STRL SZ 6.5 (GLOVE) ×2 IMPLANT
GLOVE BIOGEL PI IND STRL 7.0 (GLOVE) ×3 IMPLANT
GLOVE BIOGEL PI INDICATOR 7.0 (GLOVE) ×3
GOWN STRL REUS W/ TWL LRG LVL3 (GOWN DISPOSABLE) ×3 IMPLANT
GOWN STRL REUS W/TWL LRG LVL3 (GOWN DISPOSABLE) ×3
HEMOSTAT ARISTA ABSORB 3G PWDR (HEMOSTASIS) ×1 IMPLANT
HIBICLENS CHG 4% 4OZ (MISCELLANEOUS) ×1 IMPLANT
KIT TURNOVER CYSTO (KITS) ×2 IMPLANT
NEEDLE HYPO 22GX1.5 SAFETY (NEEDLE) ×2 IMPLANT
NS IRRIG 1000ML POUR BTL (IV SOLUTION) ×3 IMPLANT
PACK ABDOMINAL GYN (CUSTOM PROCEDURE TRAY) ×2 IMPLANT
PAD ABD 8X10 STRL (GAUZE/BANDAGES/DRESSINGS) ×1 IMPLANT
PAD ARMBOARD 7.5X6 YLW CONV (MISCELLANEOUS) ×2 IMPLANT
PAD OB MATERNITY 4.3X12.25 (PERSONAL CARE ITEMS) ×1 IMPLANT
SPONGE LAP 18X18 RF (DISPOSABLE) ×5 IMPLANT
STAPLER VISISTAT 35W (STAPLE) IMPLANT
SUT CHROMIC GUT 2 0 PS 2 27 (SUTURE) ×1 IMPLANT
SUT VIC AB 0 CT1 18XCR BRD8 (SUTURE) ×4 IMPLANT
SUT VIC AB 0 CT1 27 (SUTURE) ×1
SUT VIC AB 0 CT1 27XBRD ANBCTR (SUTURE) ×1 IMPLANT
SUT VIC AB 0 CT1 36 (SUTURE) ×4 IMPLANT
SUT VIC AB 0 CT1 8-18 (SUTURE) ×5
SUT VIC AB 2-0 CT1 27 (SUTURE) ×1
SUT VIC AB 2-0 CT1 TAPERPNT 27 (SUTURE) ×1 IMPLANT
SUT VIC AB 4-0 PS2 27 (SUTURE) IMPLANT
SUT VICRYL 0 TIES 12 18 (SUTURE) ×2 IMPLANT
SYR CONTROL 10ML LL (SYRINGE) ×2 IMPLANT
TOWEL OR 17X26 10 PK STRL BLUE (TOWEL DISPOSABLE) ×4 IMPLANT
TRAY FOLEY W/BAG SLVR 14FR (SET/KITS/TRAYS/PACK) ×2 IMPLANT

## 2019-08-16 NOTE — Anesthesia Procedure Notes (Signed)
Anesthesia Regional Block: TAP block   Pre-Anesthetic Checklist: ,, timeout performed, Correct Patient, Correct Site, Correct Laterality, Correct Procedure,, site marked, risks and benefits discussed, Surgical consent,  Pre-op evaluation,  At surgeon's request and post-op pain management  Laterality: Left  Prep: chloraprep       Needles:  Injection technique: Single-shot  Needle Type: Echogenic Stimulator Needle     Needle Length: 10cm  Needle Gauge: 21     Additional Needles:   Procedures:,,,, ultrasound used (permanent image in chart),,,,   Nerve Stimulator or Paresthesia:  Response: Quadriceps muscle contraction, 0.45 mA,   Additional Responses:   Narrative:  Start time: 08/16/2019 12:50 PM End time: 08/16/2019 1:00 PM Injection made incrementally with aspirations every 5 mL.  Performed by: Personally  Anesthesiologist: Murvin Natal, MD  Additional Notes: Functioning IV was confirmed and monitors were applied.  A 157mm 21ga Pajunk echogenic stimulator needle was used. Sterile prep and drape,hand hygiene and sterile gloves were used.  Negative aspiration and negative test dose prior to incremental administration of local anesthetic. The patient tolerated the procedure well.

## 2019-08-16 NOTE — Anesthesia Preprocedure Evaluation (Addendum)
Anesthesia Evaluation  Patient identified by MRN, date of birth, ID band Patient awake    Reviewed: Allergy & Precautions, NPO status , Patient's Chart, lab work & pertinent test results  Airway Mallampati: III  TM Distance: >3 FB Neck ROM: Full    Dental no notable dental hx.  Braces:   Pulmonary neg pulmonary ROS,    Pulmonary exam normal breath sounds clear to auscultation       Cardiovascular negative cardio ROS Normal cardiovascular exam Rhythm:Regular Rate:Normal     Neuro/Psych negative neurological ROS  negative psych ROS   GI/Hepatic negative GI ROS, Neg liver ROS,   Endo/Other  negative endocrine ROS  Renal/GU negative Renal ROS     Musculoskeletal negative musculoskeletal ROS (+)   Abdominal (+) + obese,   Peds  Hematology  (+) anemia , REFUSES BLOOD PRODUCTS, JEHOVAH'S WITNESS  Anesthesia Other Findings Fibroids  Reproductive/Obstetrics hcg negative                            Anesthesia Physical Anesthesia Plan  ASA: II  Anesthesia Plan: General and Regional   Post-op Pain Management: GA combined w/ Regional for post-op pain   Induction: Intravenous  PONV Risk Score and Plan: 4 or greater and Ondansetron, Dexamethasone, Midazolam and Treatment may vary due to age or medical condition  Airway Management Planned: Oral ETT  Additional Equipment:   Intra-op Plan:   Post-operative Plan: Extubation in OR  Informed Consent: I have reviewed the patients History and Physical, chart, labs and discussed the procedure including the risks, benefits and alternatives for the proposed anesthesia with the patient or authorized representative who has indicated his/her understanding and acceptance.     Dental advisory given  Plan Discussed with: CRNA  Anesthesia Plan Comments: (Bilateral TAP block per surgeon request)       Anesthesia Quick Evaluation

## 2019-08-16 NOTE — Progress Notes (Signed)
Assisted Dr. Roanna Banning with right, left, ultrasound guided, transabdominal plane block. Side rails up, monitors on throughout procedure. See vital signs in flow sheet. Tolerated Procedure well.

## 2019-08-16 NOTE — H&P (Signed)
History:  44 y.o. G1P0010 here today for follow up for heavy periods. Lasting up to 7 days. Feels periods are heavier than they were in the past. In October, she had her period twice, normally around 28 days. On heavy days, she changes pads/tampons 5-6 times. Denies light-headedness or dizziness.  Has pain in lower abdomen, goes all the way around her waist. In the front, she feels like she has "swelling" and towards the back, feels like "burning". Feels this pain before and during her periods. Pain is worse on her heavy days. She must take naproxen during her heavy days. Has one day where it is hard to do daily activities. Headache and some dizziness during her period.   Has pain with intercourse, has been going on for at least 10 years. Was diagnosed with fibroids in 2015, was given the options of surgery or to wait for menopause. She did not want to have children, opted against surgery. Not using anything for contraception, not interested in having kids.   Pap 2020 was normal.   History reviewed. No pertinent past medical history.       Past Surgical History:  Procedure Laterality Date  . AUGMENTATION MAMMAPLASTY Bilateral 2004  . BREAST SURGERY     [place]  . COSMETIC SURGERY     Social History   Socioeconomic History  . Marital status: Married    Spouse name: Not on file  . Number of children: 0  . Years of education: Not on file  . Highest education level: 12th grade  Occupational History  . Not on file  Tobacco Use  . Smoking status: Never Smoker  . Smokeless tobacco: Never Used  Substance and Sexual Activity  . Alcohol use: Yes    Comment: RARE  . Drug use: No  . Sexual activity: Yes    Birth control/protection: None  Other Topics Concern  . Not on file  Social History Narrative  . Not on file   Social Determinants of Health   Financial Resource Strain:   . Difficulty of Paying Living Expenses: Not on file  Food Insecurity:   . Worried About Paediatric nurse in the Last Year: Not on file  . Ran Out of Food in the Last Year: Not on file  Transportation Needs: No Transportation Needs  . Lack of Transportation (Medical): No  . Lack of Transportation (Non-Medical): No  Physical Activity:   . Days of Exercise per Week: Not on file  . Minutes of Exercise per Session: Not on file  Stress:   . Feeling of Stress : Not on file  Social Connections:   . Frequency of Communication with Friends and Family: Not on file  . Frequency of Social Gatherings with Friends and Family: Not on file  . Attends Religious Services: Not on file  . Active Member of Clubs or Organizations: Not on file  . Attends Archivist Meetings: Not on file  . Marital Status: Not on file  Intimate Partner Violence:   . Fear of Current or Ex-Partner: Not on file  . Emotionally Abused: Not on file  . Physically Abused: Not on file  . Sexually Abused: Not on file   Jehovah's Witness, refuses any blood products  Current Outpatient Medications:  .  ibuprofen (ADVIL,MOTRIN) 800 MG tablet, Take 1 tablet (800 mg total) by mouth every 8 (eight) hours as needed. (Patient not taking: Reported on 04/02/2017), Disp: 30 tablet, Rfl: 5 .  metroNIDAZOLE (METROGEL) 0.75 % vaginal  gel, Place 1 Applicatorful vaginally 2 (two) times daily. (Patient not taking: Reported on 04/02/2017), Disp: 70 g, Rfl: 0 .  nitrofurantoin, macrocrystal-monohydrate, (MACROBID) 100 MG capsule, Take 1 capsule (100 mg total) by mouth 2 (two) times daily. (Patient not taking: Reported on 04/02/2017), Disp: 14 capsule, Rfl: 0  The following portions of the patient's history were reviewed and updated as appropriate: allergies, current medications, past family history, past medical history, past social history, past surgical history and problem list.   Review of Systems:  Pertinent items noted in HPI and remainder of comprehensive ROS otherwise negative. Objective:    CONSTITUTIONAL:  Well-developed, well-nourished female in no acute distress.  HENT:  Normocephalic, atraumatic. External right and left ear normal. Oropharynx is clear and moist EYES: Conjunctivae and EOM are normal. Pupils are equal, round, and reactive to light. No scleral icterus.  NECK: Normal range of motion, supple, no masses SKIN: Skin is warm and dry. No rash noted. Not diaphoretic. No erythema. No pallor. NEUROLOGIC: Alert and oriented to person, place, and time. Normal reflexes, muscle tone coordination. No cranial nerve deficit noted. PSYCHIATRIC: Normal mood and affect. Normal behavior. Normal judgment and thought content. CARDIOVASCULAR: Normal heart rate noted RESPIRATORY: Effort normal, no problems with respiration noted ABDOMEN: Soft, no distention noted.  Uterus palpable to umbilicus PELVIC: Normal appearing external genitalia; normal appearing vaginal mucosa and cervix.  No abnormal discharge noted.  pelvic cultures obtained. Enlarged mobile fibroid uterus, extending to umbilicus, diffuse tenderness throughout pelvis, no adenexal masses palpable. MUSCULOSKELETAL: Normal range of motion. No edema noted.  Exam done with chaperone present.  Labs and Imaging Imaging Results  No results found.    Assessment & Plan:   1. Abnormal uterine bleeding (AUB) - Patient is taking hormonal management of heavy bleeding, EMB to rule out malignancy was benign,  she would prefer to proceed directly with surgery given the long time pain she has had.  2. Pelvic pain Likely due to enlarged fibroids  3. Fibroids Enlarged fibroid uterus - TVUS to assess fibroids, result reviewed   4. TAH/BS. Marland Kitchen  The risks of surgery were discussed in detail with the patient including but not limited to: bleeding which may require transfusion or reoperation, she refuses all blood products; infection which may require prolonged hospitalization or re-hospitalization and antibiotic therapy; injury to bowel, bladder,  ureters and major vessels or other surrounding organs; need for additional procedures including laparotomy; thromboembolic phenomenon, incisional problems and other postoperative or anesthesia complications.  Patient was told that the likelihood that her condition and symptoms will be treated effectively with this surgical management was very high; the postoperative expectations were also discussed in detail. The patient also understands the alternative treatment options which were discussed in full. All questions were answered.    Woodroe Mode, MD 08/16/2019 12:24 PM

## 2019-08-16 NOTE — Anesthesia Postprocedure Evaluation (Signed)
Anesthesia Post Note  Patient: Ashley Golden  Procedure(s) Performed: HYSTERECTOMY ABDOMINAL WITH SALPINGECTOMY (Bilateral Abdomen)     Patient location during evaluation: PACU Anesthesia Type: General Level of consciousness: awake and alert, oriented and patient cooperative Pain management: pain level controlled Vital Signs Assessment: post-procedure vital signs reviewed and stable Respiratory status: spontaneous breathing, nonlabored ventilation and respiratory function stable Cardiovascular status: blood pressure returned to baseline and stable Postop Assessment: no apparent nausea or vomiting Anesthetic complications: no    Last Vitals:  Vitals:   08/16/19 1515 08/16/19 1530  BP: 124/88 116/75  Pulse: 80 76  Resp: 13 14  Temp:    SpO2: 99% 100%    Last Pain:  Vitals:   08/16/19 1156  TempSrc: Oral  PainSc: 0-No pain                 Pervis Hocking

## 2019-08-16 NOTE — Transfer of Care (Signed)
Immediate Anesthesia Transfer of Care Note  Patient: Ashley Golden  Procedure(s) Performed: HYSTERECTOMY ABDOMINAL WITH SALPINGECTOMY (Bilateral Abdomen)  Patient Location: PACU  Anesthesia Type:General  Level of Consciousness: awake, alert  and oriented  Airway & Oxygen Therapy: Patient Spontanous Breathing and Patient connected to nasal cannula oxygen  Post-op Assessment: Report given to RN and Post -op Vital signs reviewed and stable  Post vital signs: Reviewed and stable  Last Vitals:  Vitals Value Taken Time  BP    Temp    Pulse 77 08/16/19 1456  Resp 11 08/16/19 1456  SpO2 100 % 08/16/19 1456  Vitals shown include unvalidated device data.  Last Pain:  Vitals:   08/16/19 1156  TempSrc: Oral  PainSc: 0-No pain      Patients Stated Pain Goal: 6 ([id number])  Complications: No apparent anesthesia complications

## 2019-08-16 NOTE — Op Note (Signed)
Ashley Golden PROCEDURE DATE: 08/16/2019  PREOPERATIVE DIAGNOSES:  Symptomatic fibroids, abnormal uterine bleeding POSTOPERATIVE DIAGNOSES:  The same SURGEON: Woodroe Mode, MD ASSISTANT: . Dr. Verita Schneiders An experienced assistant was required given the standard of surgical care given the complexity of the case.  This assistant was needed for exposure, dissection, suctioning, retraction, instrument exchange, and for overall help during the procedure. OPERATION:  Total abdominal hysterectomy, Bilateral Salpingectomy ANESTHESIA:  General endotracheal, TAP block   INDICATIONS: The patient is a 44 y.o. G1P0010 with the aforementioned diagnoses who desires definitive surgical management. On the preoperative visit, the risks, benefits, indications, and alternatives of the procedure were reviewed with the patient.  On the day of surgery, the risks of surgery were again discussed with the patient including but not limited to: bleeding which may require transfusion or reoperation; infection which may require antibiotics; injury to bowel, bladder, ureters or other surrounding organs; need for additional procedures; thromboembolic phenomenon, incisional problems and other postoperative/anesthesia complications. Written informed consent was obtained.    OPERATIVE FINDINGS: A 18 week size irregular uterus with normal tubes and ovaries bilaterally.  ESTIMATED BLOOD LOSS: 125 ml FLUIDS:  1600 ml of Lactated Ringers URINE OUTPUT:  275 ml of clear yellow urine. SPECIMENS:  Uterus,cervix,  bilateral fallopian tubes sent to pathology COMPLICATIONS:  None immediate.   DESCRIPTION OF PROCEDURE:  The patient received intravenous antibiotics and had sequential compression devices applied to her lower extremities while in the preoperative area. TAP block was placed in holding.  She was taken to the operating room and placed under general anesthesia without difficulty.The abdomen and perineum were prepped and  draped in a sterile manner, and she was placed in a dorsal supine position.  A Foley catheter was inserted into the bladder and attached to constant drainage. After an adequate timeout was performed, a Pfannensteil skin incision was made. This incision was taken down to the fascia using electrocautery with care given to maintain good hemostasis. The fascia was incised in the midline and the fascial incision was then extended bilaterally using electrocautery without difficulty. The fascia was then dissected off the underlying rectus muscles using blunt and sharp dissection. The rectus muscles were split bluntly in the midline and the peritoneum entered sharply without complication. This peritoneal incision was then extended superiorly and inferiorly with care given to prevent bowel or bladder injury. Attention was then turned to the pelvis.. The uterus which was large and irregular at this point was noted to be mobilized and was delivered up out of the abdomen.  The round ligaments on each side were clamped, suture ligated with 0 Vicryl, and transected with electrocautery allowing entry into the broad ligament. Of note, all sutures used in this procedure are 0 Vicryl unless otherwise noted. The anterior and posterior leaves of the broad ligament were separated, and the ureters were inspected to be safely away from the area of dissection bilaterally.  Adnexae were clamped on the patient's right side, cut, and doubly suture ligated. This procedure was repeated in an identical fashion on the left site allowing for both adnexa to remain in place.  Kelly clamps were placed on the mesosalpinx of the right fallopian tube, and the fallopian tube was excised.  The pedicle was then secured with a free tie.  A similar process was carried out on the left side, allowing for bilateral salpingectomy. A bladder flap was then created.  The bladder was then bluntly dissected off the lower uterine segment and cervix with  good  hemostasis noted. The uterine arteries were then skeletonized bilaterally and then clamped, cut, and doubly suture ligated with care given to prevent ureteral injury. The uterosacral ligaments were then clamped, cut, and ligated bilaterally.  Finally, the cardinal ligaments were clamped, cut, and ligated bilaterally.  Acutely curved clamps were placed across the vagina just under the cervix, and the specimen was amputated and sent to pathology. The vaginal cuff angles were closed with Heaney stiches with care given to incorporate the uterosacral-cardinal ligament pedicles on both sides. The middle of the vaginal cuff was closed with a series of interrupted figure-of-eight sutures with care given to incorporate the anterior pubocervical fascia and the posterior rectovaginal fascia.   The pelvis was irrigated and hemostasis was reconfirmed at all pedicles and along the pelvic sidewall.  The ureters were inspected and noted to be peristalsing bilaterally.  All laparotomy sponges and instruments were removed from the abdomen. The peritoneum was closed with a running stitch, and the fascia was also closed in a running fashion. The subcutaneous layer was reapproximated with 2-0 plain gut. The skin was closed with a 4-0 Vicryl subcuticular stitch. Sponge, lap, needle, and instrument counts were correct times two. The patient was taken to the recovery area awake, extubated and in stable condition.   Woodroe Mode, MD [place], [facility] for [facility], [place]

## 2019-08-16 NOTE — Plan of Care (Signed)
  Problem: Education: Goal: Knowledge of General Education information will improve Description: Including pain rating scale, medication(s)/side effects and non-pharmacologic comfort measures Outcome: Progressing   Problem: Clinical Measurements: Goal: Ability to maintain clinical measurements within normal limits will improve Outcome: Progressing   Problem: Activity: Goal: Risk for activity intolerance will decrease Outcome: Progressing   Problem: Coping: Goal: Level of anxiety will decrease Outcome: Progressing   Problem: Pain Managment: Goal: General experience of comfort will improve Outcome: Progressing   Problem: Education: Goal: Knowledge of the prescribed therapeutic regimen will improve Outcome: Progressing

## 2019-08-16 NOTE — Anesthesia Procedure Notes (Signed)
Anesthesia Regional Block: TAP block   Pre-Anesthetic Checklist: ,, timeout performed, Correct Patient, Correct Site, Correct Laterality, Correct Procedure,, site marked, risks and benefits discussed, Surgical consent,  Pre-op evaluation,  At surgeon's request and post-op pain management  Laterality: Right  Prep: chloraprep       Needles:  Injection technique: Single-shot  Needle Type: Echogenic Stimulator Needle     Needle Length: 10cm  Needle Gauge: 21     Additional Needles:   Procedures:,,,, ultrasound used (permanent image in chart),,,,  Narrative:  Start time: 08/16/2019 12:40 PM End time: 08/16/2019 12:50 PM Injection made incrementally with aspirations every 5 mL.  Performed by: Personally  Anesthesiologist: Murvin Natal, MD  Additional Notes: Functioning IV was confirmed and monitors were applied.  A 160mm 21ga Pajunk echogenic stimulator needle was used. Sterile prep and drape,hand hygiene and sterile gloves were used.  Negative aspiration and negative test dose prior to incremental administration of local anesthetic. The patient tolerated the procedure well.

## 2019-08-16 NOTE — Anesthesia Procedure Notes (Signed)
Procedure Name: Intubation Date/Time: 08/16/2019 1:05 PM Performed by: Lollie Sails, CRNA Pre-anesthesia Checklist: Patient identified, Emergency Drugs available, Suction available, Patient being monitored and Timeout performed Patient Re-evaluated:Patient Re-evaluated prior to induction Oxygen Delivery Method: Circle system utilized Preoxygenation: Pre-oxygenation with 100% oxygen Induction Type: IV induction Ventilation: Mask ventilation without difficulty Laryngoscope Size: Miller and 3 Grade View: Grade II Tube type: Oral Tube size: 7.0 mm Number of attempts: 1 Airway Equipment and Method: Stylet Placement Confirmation: ETT inserted through vocal cords under direct vision,  positive ETCO2 and breath sounds checked- equal and bilateral Secured at: 23 cm Tube secured with: Tape Dental Injury: Teeth and Oropharynx as per pre-operative assessment

## 2019-08-17 LAB — CBC
HCT: 33.4 % — ABNORMAL LOW (ref 36.0–46.0)
Hemoglobin: 9.9 g/dL — ABNORMAL LOW (ref 12.0–15.0)
MCH: 24.2 pg — ABNORMAL LOW (ref 26.0–34.0)
MCHC: 29.6 g/dL — ABNORMAL LOW (ref 30.0–36.0)
MCV: 81.7 fL (ref 80.0–100.0)
Platelets: 222 10*3/uL (ref 150–400)
RBC: 4.09 MIL/uL (ref 3.87–5.11)
WBC: 11.5 10*3/uL — ABNORMAL HIGH (ref 4.0–10.5)
nRBC: 0 % (ref 0.0–0.2)

## 2019-08-17 MED ORDER — FLUCONAZOLE 150 MG PO TABS
150.0000 mg | ORAL_TABLET | Freq: Every day | ORAL | Status: DC
  Administered 2019-08-17 – 2019-08-18 (×2): 150 mg via ORAL
  Filled 2019-08-17 (×2): qty 1

## 2019-08-17 NOTE — Progress Notes (Signed)
1 Day Post-Op Procedure(s) (LRB): HYSTERECTOMY ABDOMINAL WITH SALPINGECTOMY (Bilateral)  Subjective: Patient reports incisional pain and tolerating PO.    Objective: I have reviewed patient's vital signs, intake and output, medications and labs. Blood pressure 109/61, pulse 68, temperature 98.4 F (36.9 C), temperature source Oral, resp. rate 15, height 5\' 2"  (1.575 m), weight 92.1 kg, last menstrual period 08/13/2019, SpO2 97 %.  General: alert, cooperative and no distress Resp: effort normal GI: not distended, binder in place Extremities: extremities normal, atraumatic, no cyanosis or edema  Assessment: s/p Procedure(s): HYSTERECTOMY ABDOMINAL WITH SALPINGECTOMY (Bilateral): stable and progressing well CBC    Component Value Date/Time   WBC 11.5 (H) 08/17/2019 0513   RBC 4.09 08/17/2019 0513   HGB 9.9 (L) 08/17/2019 0513   HGB 8.2 (L) 06/03/2019 1231   HCT 33.4 (L) 08/17/2019 0513   HCT 28.2 (L) 06/03/2019 1231   PLT 222 08/17/2019 0513   PLT 300 06/03/2019 1231   MCV 81.7 08/17/2019 0513   MCV 68 (L) 06/03/2019 1231   MCH 24.2 (L) 08/17/2019 0513   MCHC 29.6 (L) 08/17/2019 0513   RDW Not Measured 08/17/2019 0513   RDW 16.5 (H) 06/03/2019 1231   LYMPHSABS 2.8 01/09/2014 0918   MONOABS 0.5 01/09/2014 0918   EOSABS 0.1 01/09/2014 0918   BASOSABS 0.0 01/09/2014 0918    Plan: Advance diet Encourage ambulation  LOS: 1 day    Emeterio Reeve 08/17/2019, 9:33 AM

## 2019-08-18 LAB — SURGICAL PATHOLOGY

## 2019-08-18 MED ORDER — IBUPROFEN 600 MG PO TABS: 600.0000 mg | ORAL_TABLET | Freq: Four times a day (QID) | ORAL | 1 refills | Status: DC | PRN

## 2019-08-18 MED ORDER — OXYCODONE-ACETAMINOPHEN 5-325 MG PO TABS: 1.0000 | ORAL_TABLET | ORAL | 0 refills | Status: DC | PRN

## 2019-08-18 NOTE — Progress Notes (Signed)
Discharge and medication instructions reviewed with patient. Questions answered and patient denies further questions. No prescriptions given. Spouse is en route to drive patient home. Donne Hazel, RN

## 2019-08-18 NOTE — Discharge Instructions (Signed)
Abdominal Hysterectomy, Care After This sheet gives you information about how to care for yourself after your procedure. Your doctor may also give you more specific instructions. If you have problems or questions, contact your doctor. Follow these instructions at home: Bathing  Do not take baths, swim, or use a hot tub until your doctor says it is okay. Ask your doctor if you can take showers. You may only be allowed to take sponge baths for bathing.  Keep the bandage (dressing) dry until your doctor says it can be taken off. Surgical cut (incision) care      Follow instructions from your doctor about how to take care of your cut from surgery. Make sure you: ? Wash your hands with soap and water before you change your bandage (dressing). If you cannot use soap and water, use hand sanitizer. ? Change your bandage as told by your doctor. ? Leave stitches (sutures), skin glue, or skin tape (adhesive) strips in place. They may need to stay in place for 2 weeks or longer. If tape strips get loose and curl up, you may trim the loose edges. Do not remove tape strips completely unless your doctor says it is okay.  Check your surgical cut area every day for signs of infection. Check for: ? Redness, swelling, or pain. ? Fluid or blood. ? Warmth. ? Pus or a bad smell. Activity  Do gentle, daily exercise as told by your doctor. You may be told to take short walks every day and go farther each time.  Do not lift anything that is heavier than 10 lb (4.5 kg), or the limit that your doctor tells you, until he or she says that it is safe.  Do not drive or use heavy machinery while taking prescription pain medicine.  Do not drive for 24 hours if you were given a medicine to help you relax (sedative).  Follow your doctor's advice about exercise, driving, and general activities. Ask your doctor what activities are safe for you. Lifestyle   Do not douche, use tampons, or have sex for at least 6 weeks  or as told by your doctor.  Do not drink alcohol until your doctor says it is okay.  Drink enough fluid to keep your pee (urine) clear or pale yellow.  Try to have someone at home with you for the first 1-2 weeks to help.  Do not use any products that contain nicotine or tobacco, such as cigarettes and e-cigarettes. These can slow down healing. If you need help quitting, ask your doctor. General instructions  Take over-the-counter and prescription medicines only as told by your doctor.  Do not take aspirin or ibuprofen. These medicines can cause bleeding.  To prevent or treat constipation while you are taking prescription pain medicine, your doctor may suggest that you: ? Drink enough fluid to keep your urine clear or pale yellow. ? Take over-the-counter or prescription medicines. ? Eat foods that are high in fiber, such as:  Fresh fruits and vegetables.  Whole grains.  Beans. ? Limit foods that are high in fat and processed sugars, such as fried and sweet foods.  Keep all follow-up visits as told by your doctor. This is important. Contact a doctor if:  You have chills or fever.  You have redness, swelling, or pain around your cut.  You have fluid or blood coming from your cut.  Your cut feels warm to the touch.  You have pus or a bad smell coming from your cut.    Your cut breaks open.  You feel dizzy or light-headed.  You have pain or bleeding when you pee.  You keep having watery poop (diarrhea).  You keep feeling sick to your stomach (nauseous) or keep throwing up (vomiting).  You have unusual fluid (discharge) coming from your vagina.  You have a rash.  You have a reaction to your medicine.  Your pain medicine does not help. Get help right away if:  You have a fever and your symptoms get worse all of a sudden.  You have very bad belly (abdominal) pain.  You are short of breath.  You pass out (faint).  You have pain, swelling, or redness of your  leg.  You bleed a lot from your vagina and notice clumps of blood (clots). Summary  Do not take baths, swim, or use a hot tub until your doctor says it is okay. Ask your doctor if you can take showers. You may only be allowed to take sponge baths for bathing.  Follow your doctor's advice about exercise, driving, and general activities. Ask your doctor what activities are safe for you.  Do not lift anything that is heavier than 10 lb (4.5 kg), or the limit that your doctor tells you, until he or she says that it is safe.  Try to have someone at home with you for the first 1-2 weeks to help. This information is not intended to replace advice given to you by your health care provider. Make sure you discuss any questions you have with your health care provider. Document Revised: 08/12/2018 Document Reviewed: 05/28/2016 Elsevier Patient Education  2020 Elsevier Inc.  

## 2019-08-18 NOTE — Progress Notes (Signed)
Patient refused to walk hall last night, but ambulated entire hall this a.m. Slightly dizzy when standing up, but no dizziness during walk.

## 2019-08-18 NOTE — Addendum Note (Signed)
Addendum  created 08/18/19 1000 by Lollie Sails, CRNA   Charge Capture section accepted

## 2019-08-18 NOTE — Discharge Summary (Signed)
Gynecology Physician Postoperative Discharge Summary  Patient ID: Ashley Golden MRN: [id number] DOB/AGE: 07-17-1975 44 y.o.  Admit Date: 08/16/2019 Discharge Date: 08/18/2019  Preoperative Diagnoses: Fibroid uterus  Procedures: Procedure(s) (LRB): HYSTERECTOMY ABDOMINAL WITH SALPINGECTOMY (Bilateral)  Hospital Course:  Ashley Golden is a 44 y.o. G1P0010  admitted for scheduled surgery.  She underwent the procedures as mentioned above, her operation was uncomplicated. For further details about surgery, please refer to the operative report. Patient had an uncomplicated postoperative course. By time of discharge on POD#2, her pain was controlled on oral pain medications; she was ambulating, voiding without difficulty, tolerating regular diet and passing flatus. She was deemed stable for discharge to home.   Significant Labs: CBC Latest Ref Rng & Units 08/17/2019 08/10/2019 06/03/2019  WBC 4.0 - 10.5 K/uL 11.5(H) 6.9 7.0  Hemoglobin 12.0 - 15.0 g/dL 9.9(L) 10.8(L) 8.2(L)  Hematocrit 36.0 - 46.0 % 33.4(L) 37.2 28.2(L)  Platelets 150 - 400 K/uL 222 263 300    Discharge Exam: Blood pressure 107/61, pulse 65, temperature 98.1 F (36.7 C), temperature source Oral, resp. rate 18, height 5\' 2"  (1.575 m), weight 92.1 kg, last menstrual period 08/13/2019, SpO2 94 %. General appearance: alert and no distress  Resp: clear to auscultation bilaterally  Cardio: regular rate and rhythm  GI: soft, non-tender; bowel sounds normal; no masses, no organomegaly.  Incision: C/D/I, no erythema, no drainage noted Pelvic: scant blood on pad  Extremities: extremities normal, atraumatic, no cyanosis or edema and Homans sign is negative, no sign of DVT  Discharged Condition: Stable  Disposition: Discharge disposition: 01-Home or Self Care       Discharge Instructions    Discharge patient   Complete by: As directed    Discharge disposition: 01-Home or Self Care   Discharge patient date: 08/18/2019     Allergies as of 08/18/2019   No Known Allergies     Medication List    STOP taking these medications   acetaminophen 325 MG tablet Commonly known as: TYLENOL   ferrous sulfate 325 (65 FE) MG tablet Commonly known as: FerrouSul   medroxyPROGESTERone 10 MG tablet Commonly known as: PROVERA   norethindrone 5 MG tablet Commonly known as: AYGESTIN     TAKE these medications   cetirizine 10 MG tablet Commonly known as: ZYRTEC Take 10 mg by mouth daily.   fluticasone 50 MCG/ACT nasal spray Commonly known as: Flonase Use 2 sprays in each nostril daily for 3 days. Then use 1 spray in each nostril daily. What changed:   how much to take  how to take this  when to take this  additional instructions   ibuprofen 600 MG tablet Commonly known as: ADVIL Take 1 tablet (600 mg total) by mouth every 6 (six) hours as needed. What changed:   medication strength  how much to take  when to take this   oxyCODONE-acetaminophen 5-325 MG tablet Commonly known as: PERCOCET/ROXICET Take 1-2 tablets by mouth every 4 (four) hours as needed for moderate pain.        Signed: Woodroe Mode, MD, [place], [facility]

## 2019-08-23 ENCOUNTER — Telehealth (INDEPENDENT_AMBULATORY_CARE_PROVIDER_SITE_OTHER): Payer: Self-pay | Admitting: Lactation Services

## 2019-08-23 DIAGNOSIS — Z4889 Encounter for other specified surgical aftercare: Secondary | ICD-10-CM

## 2019-08-23 NOTE — Telephone Encounter (Signed)
Patient called in to ask about wound care post hysterectomy. Wound care reviewed. Patient asking when she needs to follow up, discussed the front desk will be calling her to schedule per Dr. Request. Patient voiced understanding.

## 2019-09-01 ENCOUNTER — Telehealth (INDEPENDENT_AMBULATORY_CARE_PROVIDER_SITE_OTHER): Payer: Self-pay

## 2019-09-01 ENCOUNTER — Other Ambulatory Visit: Payer: Self-pay

## 2019-09-01 ENCOUNTER — Ambulatory Visit (INDEPENDENT_AMBULATORY_CARE_PROVIDER_SITE_OTHER): Payer: Self-pay | Admitting: Obstetrics & Gynecology

## 2019-09-01 VITALS — Ht 62.0 in | Wt 200.6 lb

## 2019-09-01 DIAGNOSIS — Z4889 Encounter for other specified surgical aftercare: Secondary | ICD-10-CM

## 2019-09-01 DIAGNOSIS — Z9889 Other specified postprocedural states: Secondary | ICD-10-CM

## 2019-09-01 MED ORDER — TRAMADOL-ACETAMINOPHEN 37.5-325 MG PO TABS: 1.0000 | ORAL_TABLET | Freq: Four times a day (QID) | ORAL | 0 refills | Status: DC | PRN

## 2019-09-01 MED ORDER — IBUPROFEN 600 MG PO TABS: 600.0000 mg | ORAL_TABLET | Freq: Four times a day (QID) | ORAL | 1 refills | Status: AC | PRN

## 2019-09-01 NOTE — Progress Notes (Signed)
Ashley Golden in [place] is a 44 y.o. female who presents to the clinic 2 weeks status post total abdominal hysterectomy for fibroids. Eating a regular diet without difficulty. Bowel movements are normal. Pain is not well controlled.  Medications being used: ibuprofen (OTC) and narcotic analgesics including Percocet, which has causes sleep problem and agitation.  The following portions of the patient's history were reviewed and updated as appropriate: allergies, current medications, past family history, past medical history, past social history, past surgical history and problem list.  Review of Systems Pertinent items are noted in HPI.    Objective:    Ht 5\' 2"  (1.575 m)   Wt 200 lb 9.6 oz (91 kg)   LMP 08/13/2019   BMI 36.69 kg/m  General:  alert, cooperative and mild distress  Abdomen: soft, bowel sounds active, non-tender  Incision:   healing well, no drainage, no erythema, no hernia, no seroma, no swelling, slight bruising on the left and tender, no dehiscence, incision well approximated     Assessment:    Postoperative course complicated by pain Operative findings again reviewed. Pathology report discussed.    Plan:    1. Continue any current medications. 2. Wound care discussed. 3. Activity restrictions: no lifting more than 15 pounds 4. Anticipated return to work: 4 weeks. 5. Follow up: 2 weeks    6. Ibuprofen and Ultracet for pain.  Woodroe Mode, MD 09/01/2019

## 2019-09-01 NOTE — Telephone Encounter (Signed)
Pt called front office staff, call transferred to clinical staff; Spanish interpreter Eda present for this phone call. Pt is s/p abdominal hysterectomy on 08/16/19. Pt reports the incision site is hot to the touch, swollen, red, and painful. Pt reports taking ibuprofen 600 mg regularly, last taken 1 hour ago. States she has not been using Percocet because her body did not react well to this medication. Same day urgent appt scheduled for 2:35 today with Ilda Basset, MD and pt notified.

## 2019-09-20 ENCOUNTER — Telehealth: Payer: Self-pay

## 2019-09-20 NOTE — Telephone Encounter (Signed)
Left message for patient about completing HC 3 for the [facility] program. Left name and number for patient to call back. 

## 2019-09-28 ENCOUNTER — Ambulatory Visit (INDEPENDENT_AMBULATORY_CARE_PROVIDER_SITE_OTHER): Payer: Self-pay | Admitting: Obstetrics & Gynecology

## 2019-09-28 ENCOUNTER — Encounter: Payer: Self-pay | Admitting: Obstetrics & Gynecology

## 2019-09-28 VITALS — BP 124/85 | HR 84 | Wt 205.5 lb

## 2019-09-28 DIAGNOSIS — Z09 Encounter for follow-up examination after completed treatment for conditions other than malignant neoplasm: Secondary | ICD-10-CM

## 2019-09-28 DIAGNOSIS — K5901 Slow transit constipation: Secondary | ICD-10-CM

## 2019-09-28 MED ORDER — SENNOSIDES-DOCUSATE SODIUM 8.6-50 MG PO TABS: 1.0000 | ORAL_TABLET | Freq: Two times a day (BID) | ORAL | 1 refills | Status: AC

## 2019-09-28 MED ORDER — DULCOLAX 5 MG PO TBEC: 5.0000 mg | DELAYED_RELEASE_TABLET | Freq: Every day | ORAL | 1 refills | Status: AC | PRN

## 2019-09-28 MED ORDER — METAMUCIL 0.52 G PO CAPS: 0.5200 g | ORAL_CAPSULE | Freq: Every day | ORAL | 1 refills | Status: DC

## 2019-09-28 NOTE — Patient Instructions (Signed)

## 2019-09-28 NOTE — Progress Notes (Signed)
Feels bloated swollen like wants to know if that's normal

## 2019-09-28 NOTE — Progress Notes (Signed)
Subjective:     Ashley Golden is a 44 y.o. female who presents to the clinic 8 weeks status post total abdominal hysterectomy for abnormal uterine bleeding. Eating a regular diet with difficulty. Bowel movements are abnormal with increased constipation, last complete bowel movement over 1 week ago. . Generalized pain is relieved with tylenol.  Pt still with LLQ pain, worsened over past two days.  Increased pain with attempting to have bowel movements.   The following portions of the patient's history were reviewed and updated as appropriate: allergies, current medications, past family history, past medical history, past social history, past surgical history and problem list.  Review of Systems Pertinent items are noted in HPI. Pertinent items noted in HPI and remainder of comprehensive ROS otherwise negative.    Objective:    BP 124/85   Pulse 84   Wt 93.2 kg   BMI 37.59 kg/m  General:  alert  Abdomen: soft, bowel sounds active, mild tender to palpation in LLQ  Incision:   healing well, no drainage, no erythema, no hernia, no seroma, no swelling, no dehiscence, incision well approximated     Assessment:    Doing well postoperatively. Operative findings again reviewed. Pathology report discussed.    Plan:    1. Continue any current medications.  Added bowel regimen for constipation.  Increased H20 intake encouraged.  2. Wound care discussed. 3. Activity restrictions: none 4. Anticipated return to work: 1-2 weeks. 5. Follow up: 4 weeks, prn concerns.l.

## 2019-09-30 ENCOUNTER — Ambulatory Visit: Payer: Self-pay | Admitting: Obstetrics & Gynecology

## 2019-11-01 ENCOUNTER — Telehealth: Payer: Self-pay

## 2019-11-01 NOTE — Telephone Encounter (Signed)
Health Coaching 3  Interpreter- Rudene Anda, [place]   Goals- Patient has been reducing the amount of fats and sugars that she consumes. Encouraged patient to continue watching the amount of sweets and sugars and carbs that she consumes since her hemoglobin A1C was slightly elevated during initial visit.    New goal- Patient had a hysterectomy back in February 2021. Patient stated that she has not been able to really exercise since then. She stated that she is just now starting walking some.  Barrier to reaching goal- Still recovering from hysterectomy surgery.   Strategies to overcome- Encouraged patient to start off with short/ slow walks. Encouraged her to continue with the walks as long as she felt ok physically doing so or unless her doctor instructed her otherwise.    Navigation:  Patient is aware of  a follow up session. Patient is scheduled for follow-up appointment on Wednesday, June 9th @ 1:00 pm.    Time- 10 minutes

## 2019-11-30 ENCOUNTER — Other Ambulatory Visit: Payer: Self-pay

## 2019-11-30 ENCOUNTER — Inpatient Hospital Stay: Payer: Self-pay | Attending: Obstetrics and Gynecology | Admitting: *Deleted

## 2019-11-30 VITALS — BP 124/74 | Temp 97.7°F | Wt 207.8 lb

## 2019-11-30 DIAGNOSIS — Z0000 Encounter for general adult medical examination without abnormal findings: Secondary | ICD-10-CM

## 2019-12-01 NOTE — Progress Notes (Signed)
[facility] follow up   Interpreter: Rudene Anda, [employer]  Clinical Measurement:   Vitals:   11/30/19 1315 11/30/19 1336  BP: 122/78 124/74  Temp: 97.7 F (36.5 C)       Medical History:  Patient states that she does not have high cholesterol, does not have high blood pressure and she does not have diabetes.  Medications:  Patient states that she does not take medication to lower cholesterol, blood pressure and blood sugar.  Patient does not take an aspirin a day to help prevent a heart attack or stroke. Patient does not take medication to lower cholesterol, blood pressure or blood sugar.   Blood pressure, self measurement:  :  Patient states that she does not measure blood pressure from home. She checks her blood pressure N/A. She shares her readings with a health care provider: N/A.   Nutrition: Patient states that on average she eats 1 cups of fruit and 0 cups of vegetables per day. Patient states that she does not eat fish at least 2 times per week. Patient eats less than half servings of whole grains. Patient drinks less than 36 ounces of beverages with added sugar weekly: no. Patient is currently watching sodium or salt intake: yes. In the past 7 days patient has had 1 drinks containing alcohol. On average patient drinks 1 drinks containing alcohol per day.      Physical activity:  Patient states that she gets 120 minutes of moderate and 0 minutes of vigorous physical activity each week.  Smoking status:  Patient states that she has has never smoked .   Quality of life:  Over the past 2 weeks patient states that she had little interest or pleasure in doing things: not at all. She has been feeling down, depressed or hopeless:not at all.    Risk reduction and counseling:   Health Coaching:  Patient had a hysterectomy back at the end of February and was not able to do a lot of exercising during her recovery. Before her surgery she stated that she had lost 10 pounds but has gained  some of that back. I encouraged her that she is still down 4 pounds from her initial screening visit. Encouraged her that since she had lost weight in the past that she can do it again. Encouraged her to continue with her exercising. We did discuss adding more fruits and vegetables in her daily diet as well as whole grains and heart healthy fish. Patient currently drinks agua de fruita with added sugar daily or a soda. Encouraged her to try and cut back on the beverages with added sugars not only because of the calories but since her hemoglobin A1C was slightly elevated (5.8) during her initial screening visit. We also discussed her watching her sweets and sugars and carbs that she consumes.      Navigation: This was the  follow up session for this patient, I will check up on her progress in the coming months.  Time: 20 minutes

## 2019-12-07 ENCOUNTER — Other Ambulatory Visit: Payer: Self-pay

## 2019-12-07 DIAGNOSIS — Z1231 Encounter for screening mammogram for malignant neoplasm of breast: Secondary | ICD-10-CM

## 2019-12-22 ENCOUNTER — Other Ambulatory Visit: Payer: Self-pay

## 2019-12-22 ENCOUNTER — Ambulatory Visit: Payer: Self-pay | Admitting: *Deleted

## 2019-12-22 ENCOUNTER — Ambulatory Visit: Admission: RE | Admit: 2019-12-22 | Payer: No Typology Code available for payment source | Source: Ambulatory Visit

## 2019-12-22 VITALS — BP 112/72 | Temp 97.7°F | Wt 208.4 lb

## 2019-12-22 DIAGNOSIS — N644 Mastodynia: Secondary | ICD-10-CM

## 2019-12-22 DIAGNOSIS — Z1239 Encounter for other screening for malignant neoplasm of breast: Secondary | ICD-10-CM

## 2019-12-22 NOTE — Progress Notes (Signed)
Ms. MIKAYAH JOY is a 44 y.o. female who presents to [facility] clinic today with complaints of bilateral outer breast pain and a left breast lump.    Pap Smear: Pap smear not completed today. Last Pap smear was 03/22/2019 at [facility] clinic and was normal with negative HPV. Per patient has no history of an abnormal Pap smear. Last Pap smear result is available in Epic. Patient had a hysterectomy after her last Pap smear on 08/18/2019 for benign reasons (fibroids).   Physical exam: Breasts Breasts symmetrical. No skin abnormalities bilateral breasts. No nipple retraction bilateral breasts. No nipple discharge bilateral breasts. No lymphadenopathy. Patient complains of bilateral breast pain in the outer part of both breasts. She first noticed this pain 1 week ago and rates it as a 7 to 8/10 in severity. She also complains of a left breast lump which was not palpable today. Patient states that the breast pain is usually close to her menstrual cycle. She has bilateral breast implants.    Pelvic/Bimanual Pap is not indicated today.    Smoking History: Patient has never smoked.    Patient Navigation: Patient education provided. Access to services provided for patient through [facility] program. Lenard Lance, [place] interpreter provided through [facility].     Breast and Cervical Cancer Risk Assessment: Patient does not have family history of breast cancer, known genetic mutations, or radiation treatment to the chest before age 46. Patient does not have history of cervical dysplasia, immunocompromised, or DES exposure in-utero.  Risk Assessment    Risk Scores      12/22/2019 03/22/2019   Last edited by: Demetrius Revel, LPN [place], Heath Gold, RN   5-year risk: 0.6 % 0.6 %   Lifetime risk: 8.4 % 8.4 %         A: B[facility]CP exam without pap smear Complaint of bilateral outer breast pain.  P: Referred patient to the [facility] for a screening mammogram. Appointment scheduled December 22, 2019  at 3:50pm.  Vania Rea, RN, FNP student 12/22/2019 3:39 PM   Attestation of Supervision of Student:  I confirm that I have verified the information documented in the nurse practitioner student's note and that I have also personally reperformed the history, physical exam and all medical decision making activities.  I have verified that all services and findings are accurately documented in this student's note; and I agree with management and plan as outlined in the documentation. I have also made any necessary editorial changes.  [place], [place] for [employer], [employer] 12/22/2019 4:20 PM

## 2019-12-22 NOTE — Patient Instructions (Addendum)
Informed Ripley Fraise about breast self awareness. Patient did not need a Pap smear today due to last Pap smear was on 03/22/2019 per patient and patient has had a hysterectomy for benign reasons. Referred patient to the [facility] for screening mammogram. Appointment scheduled for December 22, 2019 at 3:50pm. Patient aware of appointment and will be there. Let patient know [facility] will follow up with her within the next couple weeks with results of her mammogram by letter or phone. Ripley Fraise verbalized understanding.  Vania Rea, RN, FNP student 4:01 PM

## 2020-03-01 ENCOUNTER — Encounter: Payer: Self-pay | Admitting: *Deleted

## 2020-12-25 ENCOUNTER — Encounter: Payer: Self-pay | Admitting: *Deleted

## 2021-04-15 ENCOUNTER — Other Ambulatory Visit: Payer: Self-pay

## 2021-04-15 DIAGNOSIS — N644 Mastodynia: Secondary | ICD-10-CM

## 2021-04-16 ENCOUNTER — Other Ambulatory Visit: Payer: Self-pay | Admitting: Obstetrics and Gynecology

## 2021-04-16 DIAGNOSIS — Z1231 Encounter for screening mammogram for malignant neoplasm of breast: Secondary | ICD-10-CM

## 2021-05-21 ENCOUNTER — Other Ambulatory Visit: Payer: Self-pay

## 2021-05-21 ENCOUNTER — Other Ambulatory Visit: Payer: Self-pay | Admitting: Obstetrics and Gynecology

## 2021-05-21 ENCOUNTER — Ambulatory Visit: Payer: Self-pay | Admitting: *Deleted

## 2021-05-21 ENCOUNTER — Ambulatory Visit
Admission: RE | Admit: 2021-05-21 | Discharge: 2021-05-21 | Disposition: A | Discharge status: Home / Self Care | Payer: No Typology Code available for payment source | Source: Ambulatory Visit | Attending: Obstetrics and Gynecology | Admitting: Obstetrics and Gynecology

## 2021-05-21 VITALS — BP 114/76 | Wt 211.7 lb

## 2021-05-21 DIAGNOSIS — Z1239 Encounter for other screening for malignant neoplasm of breast: Secondary | ICD-10-CM

## 2021-05-21 DIAGNOSIS — Z1231 Encounter for screening mammogram for malignant neoplasm of breast: Secondary | ICD-10-CM

## 2021-05-21 DIAGNOSIS — Z1211 Encounter for screening for malignant neoplasm of colon: Secondary | ICD-10-CM

## 2021-05-21 DIAGNOSIS — N644 Mastodynia: Secondary | ICD-10-CM

## 2021-05-21 NOTE — Progress Notes (Signed)
[name] is a 45 y.o. female who presents to [facility] clinic today with complaint of left outer breast pain since around January 2020 that was noted on previous exams 12/16/2018 and 12/22/2019. Diagnostic mammogram completed 12/16/2018 that was benign. Patient states the pain comes and goes. Patient rates the pain at a 7-8 out of 10 consistent with previous exam 12/22/2019. Advised patient to follow-up with her PCP or OBGYN provider about her chronic left breast pain.   Pap Smear: Pap smear not completed today. Last Pap smear was 03/22/2019 at [facility] clinic and was normal with negative HPV. Per patient has no history of an abnormal Pap smear. Patient has a history of a hysterectomy 08/18/2019 due to fibroids. Patient doesn't need any further Pap smears due to her history of a hysterectomy for benign reasons per BCCCP and ASCCP guidelines. Last Pap smear result is available in Epic.    Physical exam: Breasts Breasts symmetrical. No skin abnormalities bilateral breasts. No nipple retraction bilateral breasts. No nipple discharge bilateral breasts. No lymphadenopathy. No lumps palpated bilateral breasts. Complaints of left outer breast pain on exam consistent with previous exams 12/16/2018 and 12/22/2019.  MM DIAG BREAST W/IMPLANT TOMO BILATERAL  Result Date: 04/02/2017 CLINICAL DATA:  Diffuse, nonfocal pain in the lateral left breast for the past year. She reports no pain today. EXAM: 2D DIGITAL DIAGNOSTIC BILATERAL MAMMOGRAM WITH IMPLANTS, CAD AND ADJUNCT TOMO The patient has retroglandular implants. Standard and implant displaced views were performed. COMPARISON:  Previous exam(s). ACR Breast Density Category d: The breast tissue is extremely dense, which lowers the sensitivity of mammography. FINDINGS: No mammographic findings suspicious for malignancy in either breast. The retroglandular breast implants appear intact. Mammographic images were processed with CAD. IMPRESSION: No evidence of malignancy.  RECOMMENDATION: Bilateral screening mammogram in 1 year. I have discussed the findings and recommendations with the patient. Results were also provided in writing at the conclusion of the visit. If applicable, a reminder letter will be sent to the patient regarding the next appointment. BI-RADS CATEGORY  1: Negative. Electronically Signed   By: Ashley Golden M.D.   On: 04/02/2017 10:35   MS DIGITAL DIAG TOMO W/IMPLANTS BILATERAL  Result Date: 12/16/2018 CLINICAL DATA:  Left lateral breast pain. History of bilateral breast implant augmentation. EXAM: DIGITAL DIAGNOSTIC BILATERAL MAMMOGRAM WITH IMPLANTS, CAD AND TOMO ULTRASOUND LEFT BREAST The patient has retroglandular implants. Standard and implant displaced views were performed. COMPARISON:  Previous exam(s). ACR Breast Density Category d: The breast tissue is extremely dense, which lowers the sensitivity of mammography. FINDINGS: Mammographically, there are no suspicious masses, areas of nonsurgical architectural distortion or microcalcifications in either breast. Stable subglandular gel implants demonstrate capsular calcifications. Targeted ultrasound is performed, showing no suspicious masses or shadowing lesions in the area of pain in the lateral left breast. Mammographic images were processed with CAD. IMPRESSION: No mammographic sonographic evidence of malignancy in either breast. RECOMMENDATION: Further management of patient's left breast pain should be based on clinical grounds. Screening mammogram in one year.(Code:SM-B-01Y) I have discussed the findings and recommendations with the patient. Results were also provided in writing at the conclusion of the visit. If applicable, a reminder letter will be sent to the patient regarding the next appointment. BI-RADS CATEGORY  2: Benign. Electronically Signed   By: Ashley Golden M.D.   On: 12/16/2018 11:58        Pelvic/Bimanual Pap is not indicated today per BCCCP guidelines.   Smoking  History: Patient has never smoked.  Patient Navigation: Patient education provided. Access to services provided for patient through [facility]. Spanish interpreter Ashley Golden from [facility] provided.   Colorectal Cancer Screening: Per patient has never had colonoscopy completed. FIT Test given to patient to complete. No complaints today.    Breast and Cervical Cancer Risk Assessment: Patient does not have family history of breast cancer, known genetic mutations, or radiation treatment to the chest before age 73. Patient does not have history of cervical dysplasia, immunocompromised, or DES exposure in-utero.  Risk Assessment     Risk Scores       05/21/2021 12/22/2019   Last edited by: Ashley Revel, LPN Ashley Golden, Ashley Mamie Nick, LPN   5-year risk: 0.7 % 0.6 %   Lifetime risk: 8.2 % 8.4 %            A: BCCCP exam without pap smear Complaint of left outer breast pain.  P: Referred patient to the [facility] for a screening mammogram per recommendation from previous mammogram. Appointment scheduled Tuesday, May 21, 2021 at 1400.  Ashley Parish, RN 05/21/2021 1:09 PM

## 2021-05-21 NOTE — Addendum Note (Signed)
Addended by: Demetrius Revel on: 05/21/2021 01:29 PM   Modules accepted: Orders

## 2021-05-21 NOTE — Patient Instructions (Signed)
Explained breast self awareness with Ripley Fraise. Patient did not need a Pap smear today due to patient has a history of a hysterectomy for benign reasons. Let patient know that she doesn't need any further Pap smears due to her history of a hysterectomy for benign reasons. Referred patient to the [facility] for a screening mammogram per recommendation from previous mammogram. Appointment scheduled Tuesday, May 21, 2021 at 1400. Patient escorted to the mobile unit following BCCCP appointment for her screening mammogram. Let patient know the [facility] will follow up with her within the next couple weeks with results of her mammogram by letter or phone. Ripley Fraise verbalized understanding.  Tiana Sivertson, Arvil Chaco, RN 1:09 PM

## 2022-07-31 ENCOUNTER — Other Ambulatory Visit: Payer: Self-pay

## 2022-07-31 DIAGNOSIS — Z1231 Encounter for screening mammogram for malignant neoplasm of breast: Secondary | ICD-10-CM

## 2022-08-26 ENCOUNTER — Ambulatory Visit
Admission: RE | Admit: 2022-08-26 | Discharge: 2022-08-26 | Disposition: A | Discharge status: Home / Self Care | Payer: No Typology Code available for payment source | Source: Ambulatory Visit | Attending: Obstetrics and Gynecology | Admitting: Obstetrics and Gynecology

## 2022-08-26 ENCOUNTER — Ambulatory Visit: Payer: Self-pay | Admitting: *Deleted

## 2022-08-26 VITALS — BP 120/83 | Wt 206.0 lb

## 2022-08-26 DIAGNOSIS — Z1239 Encounter for other screening for malignant neoplasm of breast: Secondary | ICD-10-CM

## 2022-08-26 DIAGNOSIS — N644 Mastodynia: Secondary | ICD-10-CM

## 2022-08-26 DIAGNOSIS — Z1231 Encounter for screening mammogram for malignant neoplasm of breast: Secondary | ICD-10-CM

## 2022-08-26 DIAGNOSIS — Z1211 Encounter for screening for malignant neoplasm of colon: Secondary | ICD-10-CM

## 2022-08-26 NOTE — Progress Notes (Signed)
[name] is a 47 y.o. female who presents to [facility] clinic today with complaint of left outer breast pain since around January 2020 that was noted on previous exams 05/21/2021, 12/22/2019, and 12/16/2018. Diagnostic mammogram completed 12/16/2018 that was benign. Patient states the pain comes and goes. Patient rates the pain at a 7-8 out of 10 consistent with previous exam 05/21/2021 and 12/22/2019. Advised patient to follow-up with her PCP or OBGYN provider about her chronic left breast pain. Patient has history of breast implants 20 years ago in [place].    Pap Smear: Pap smear not completed today. Last Pap smear was 03/22/2019 at [facility] clinic and was normal with negative HPV. Per patient has no history of an abnormal Pap smear. Patient has a history of a hysterectomy 08/18/2019 due to fibroids. Patient doesn't need any further Pap smears due to her history of a hysterectomy for benign reasons per BCCCP and ASCCP guidelines. Last Pap smear result is available in Epic.     Physical exam: Breasts Breasts symmetrical. No skin abnormalities bilateral breasts. No nipple retraction bilateral breasts. No nipple discharge bilateral breasts. No lymphadenopathy. No lumps palpated bilateral breasts. Complaints of left outer breast pain on exam consistent with previous exams 12/16/2018, 12/22/2019, and 05/21/2021.      MM 3D SCREEN BREAST W/IMPLANT BILATERAL  Result Date: 05/22/2021 CLINICAL DATA:  Screening. EXAM: DIGITAL SCREENING BILATERAL MAMMOGRAM WITH IMPLANTS, CAD AND TOMOSYNTHESIS TECHNIQUE: Bilateral screening digital craniocaudal and mediolateral oblique mammograms were obtained. Bilateral screening digital breast tomosynthesis was performed. The images were evaluated with computer-aided detection. Standard and/or implant displaced views were performed. COMPARISON:  Previous exam(s). ACR Breast Density Category d: The breast tissue is extremely dense, which lowers the sensitivity of mammography.  FINDINGS: The patient has implants. There are no findings suspicious for malignancy. IMPRESSION: No mammographic evidence of malignancy. A result letter of this screening mammogram will be mailed directly to the patient. RECOMMENDATION: Screening mammogram in one year. (Code:SM-B-01Y) BI-RADS CATEGORY  1:  Negative. Electronically Signed   By: Abelardo Diesel M.D.   On: 05/22/2021 11:28   MS DIGITAL DIAG TOMO W/IMPLANTS BILATERAL  Result Date: 12/16/2018 CLINICAL DATA:  Left lateral breast pain. History of bilateral breast implant augmentation. EXAM: DIGITAL DIAGNOSTIC BILATERAL MAMMOGRAM WITH IMPLANTS, CAD AND TOMO ULTRASOUND LEFT BREAST The patient has retroglandular implants. Standard and implant displaced views were performed. COMPARISON:  Previous exam(s). ACR Breast Density Category d: The breast tissue is extremely dense, which lowers the sensitivity of mammography. FINDINGS: Mammographically, there are no suspicious masses, areas of nonsurgical architectural distortion or microcalcifications in either breast. Stable subglandular gel implants demonstrate capsular calcifications. Targeted ultrasound is performed, showing no suspicious masses or shadowing lesions in the area of pain in the lateral left breast. Mammographic images were processed with CAD. IMPRESSION: No mammographic sonographic evidence of malignancy in either breast. RECOMMENDATION: Further management of patient's left breast pain should be based on clinical grounds. Screening mammogram in one year.(Code:SM-B-01Y) I have discussed the findings and recommendations with the patient. Results were also provided in writing at the conclusion of the visit. If applicable, a reminder letter will be sent to the patient regarding the next appointment. BI-RADS CATEGORY  2: Benign. Electronically Signed   By: Fidela Salisbury M.D.   On: 12/16/2018 11:58     Pelvic/Bimanual Pap is not indicated today per BCCCP guidelines.    Smoking  History: Patient has never smoked.   Patient Navigation: Patient education provided. Access to services provided for  patient through [employer]. Spanish  interpreter Ashley Golden from [facility] provided.   Colorectal Cancer Screening: Per patient has never had colonoscopy completed. FIT Test given to patient 05/21/2021 and not returned. Explained the importance of colorectal cancer screening. FIT Test given to patient today to complete. No complaints today.    Breast and Cervical Cancer Risk Assessment: Patient does not have family history of breast cancer, known genetic mutations, or radiation treatment to the chest before age 15. Patient does not have history of cervical dysplasia, immunocompromised, or DES exposure in-utero.   Risk Scores as of 08/26/2022     Ashley Golden           5-year 1.04 %   Lifetime 11.44 %   This patient is [place]/Latina but has no documented birth country, so the [place] used data from [place] patients to calculate their risk score. Document a birth country in the Demographics activity for a more accurate score.         Last calculated by Claretha Cooper, CMA on 08/26/2022 at  1:29 PM        A: BCCCP exam without pap smear Complaint of left outer breast pain.   P: Referred patient to the [facility] for a screening mammogram on mobile unit. Appointment scheduled Tuesday, August 26, 2022 at 1350.  Loletta Parish, RN 08/26/2022 1:32 PM

## 2022-08-26 NOTE — Patient Instructions (Addendum)
Explained breast self awareness with Ripley Fraise. Patient did not need a Pap smear today due to patient has a history of a hysterectomy for benign reasons. Let patient know that she doesn't need any further Pap smears due to her history of a hysterectomy for benign reasons. Referred patient to the [facility] for a screening mammogram on mobile unit. Appointment scheduled Tuesday, August 26, 2022 at 1350. Patient aware of appointment and will be there. Let patient know the [facility] will follow up with her within the next couple weeks with results of mammogram by letter or phone. Ripley Fraise verbalized understanding.  Shandon Matson, Arvil Chaco, RN 1:32 PM

## 2022-09-03 LAB — SPECIMEN STATUS REPORT

## 2022-09-03 LAB — FECAL OCCULT BLOOD, IMMUNOCHEMICAL: Fecal Occult Bld: NEGATIVE

## 2023-02-11 IMAGING — MG DIGITAL SCREENING BREAST BILAT IMPLANT W/ TOMO W/ CAD
9 of 12 series · 9 of 28 positions shown · non-contrast
Comparison: Previous exam(s).

CLINICAL DATA: Screening.

EXAM:
DIGITAL SCREENING BILATERAL MAMMOGRAM WITH IMPLANTS, CAD AND
TOMOSYNTHESIS
TECHNIQUE: Bilateral screening digital craniocaudal and mediolateral oblique
mammograms were obtained. Bilateral screening digital breast
tomosynthesis was performed. The images were evaluated with
computer-aided detection. Standard and/or implant displaced views
were performed.

[R CC]
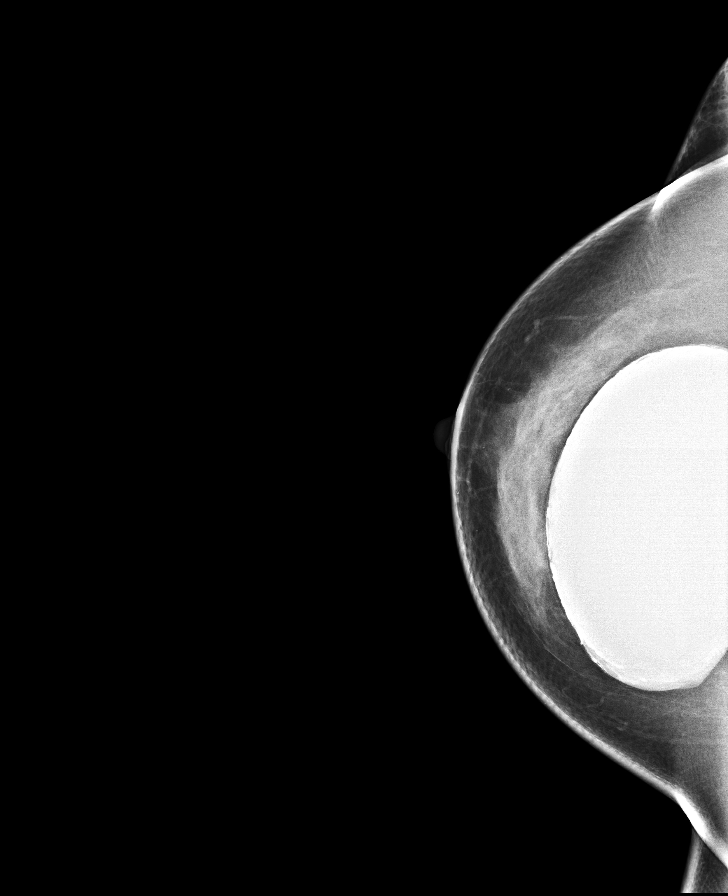

[L CC]
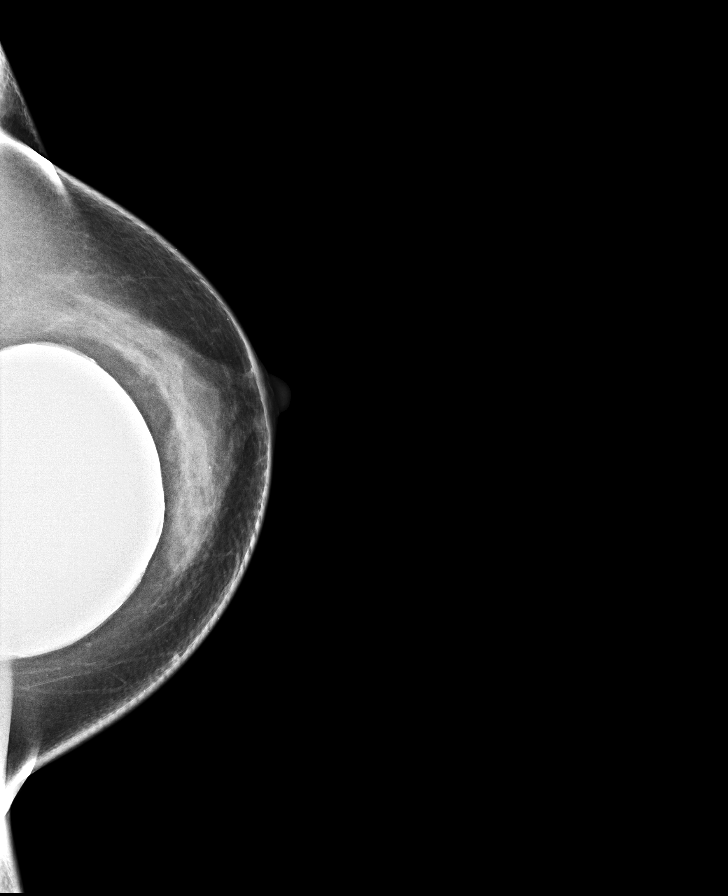

[L MLO]
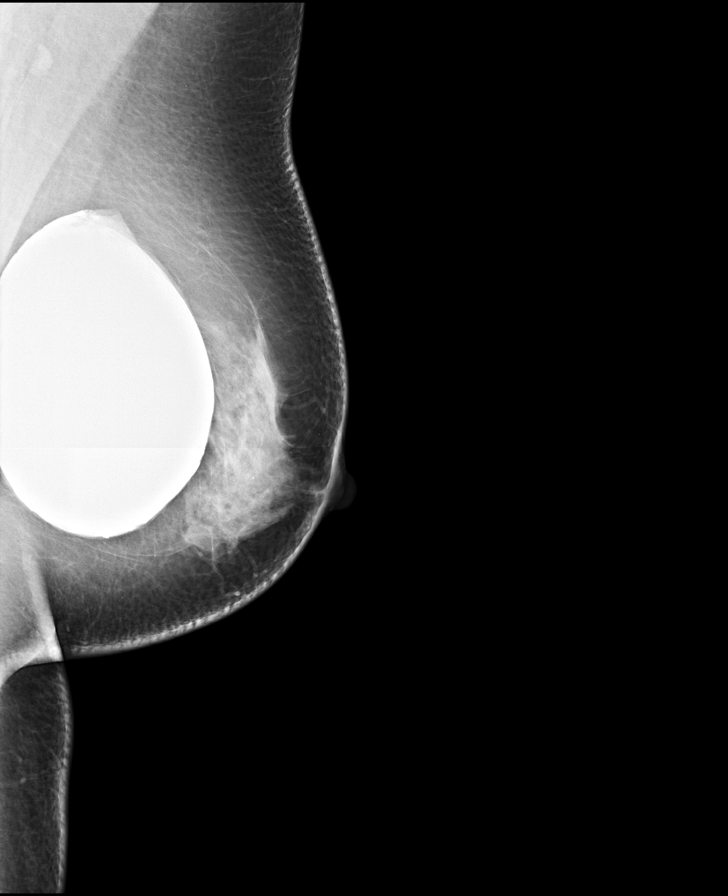

[R MLO]
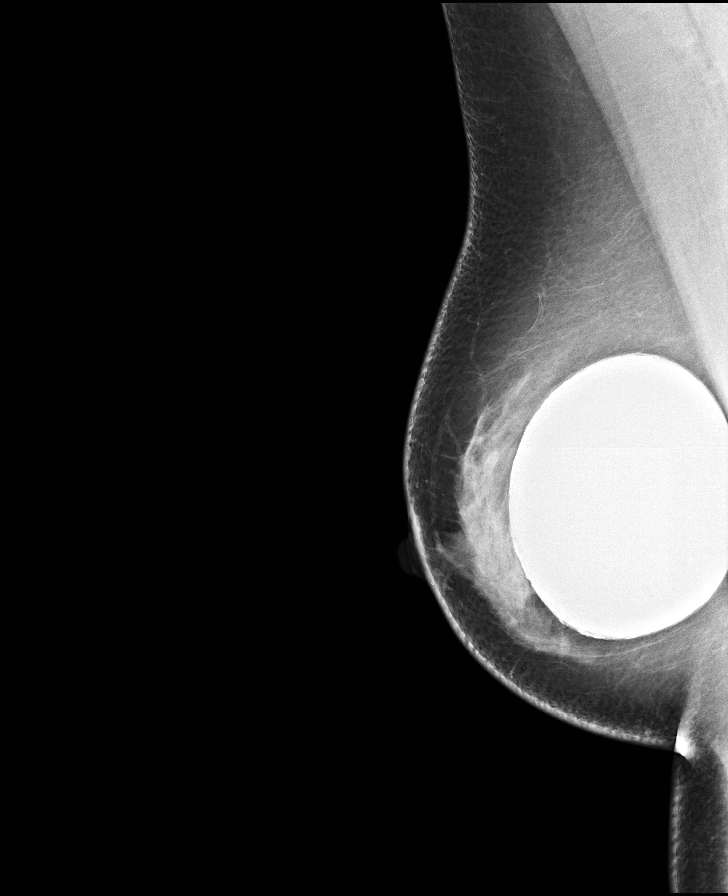

[R CC synth-2D]
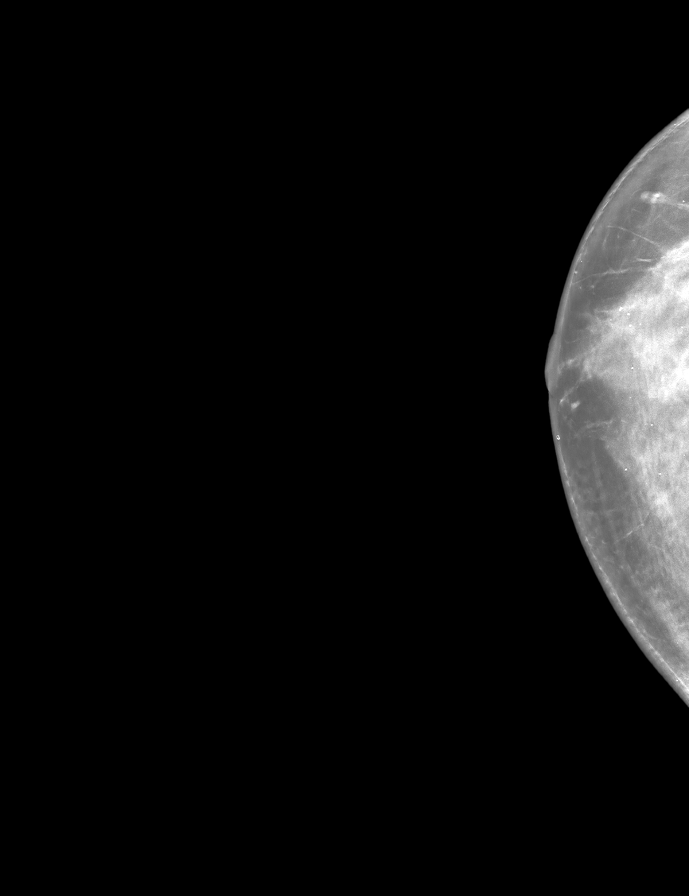

[L CC synth-2D]
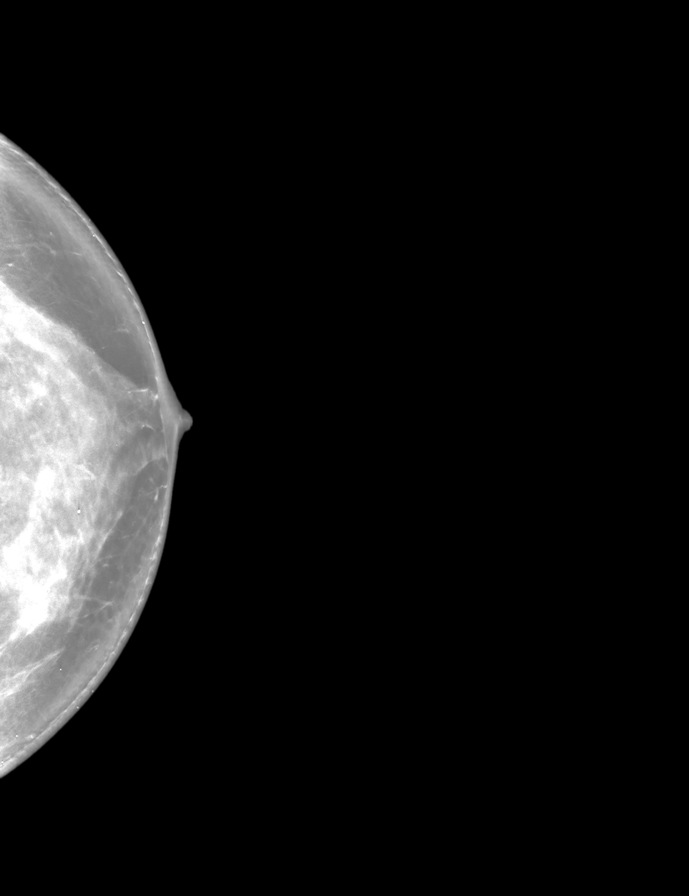

[L MLO synth-2D]
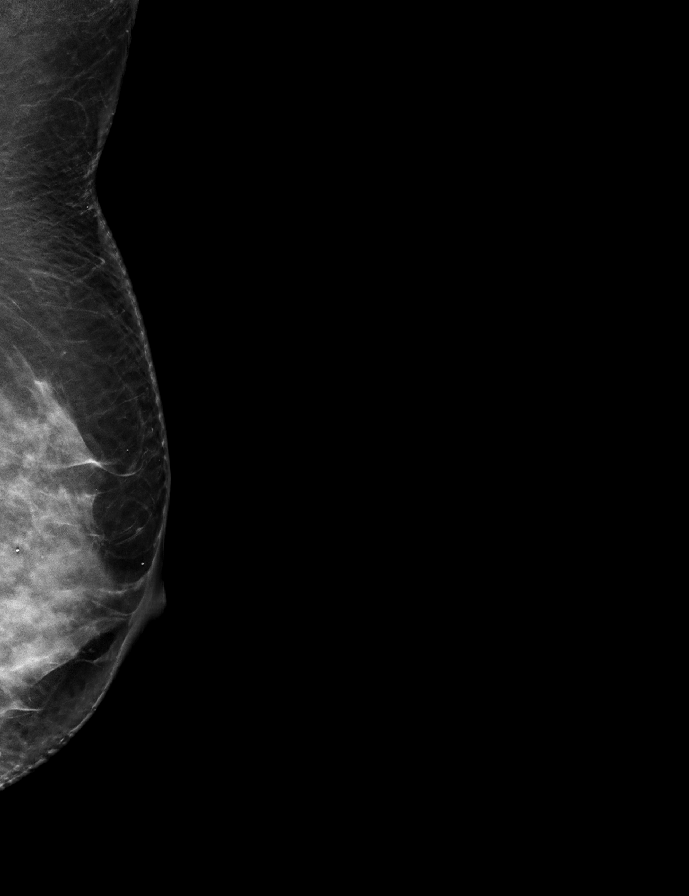

[R MLO synth-2D]
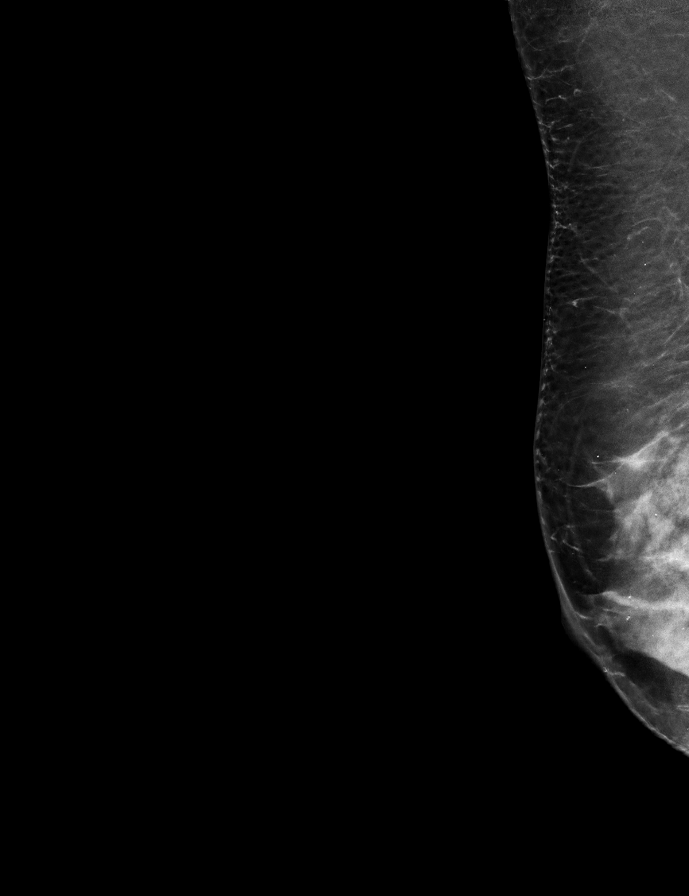

[R MLO tomo · tomo slice 32/63.0]
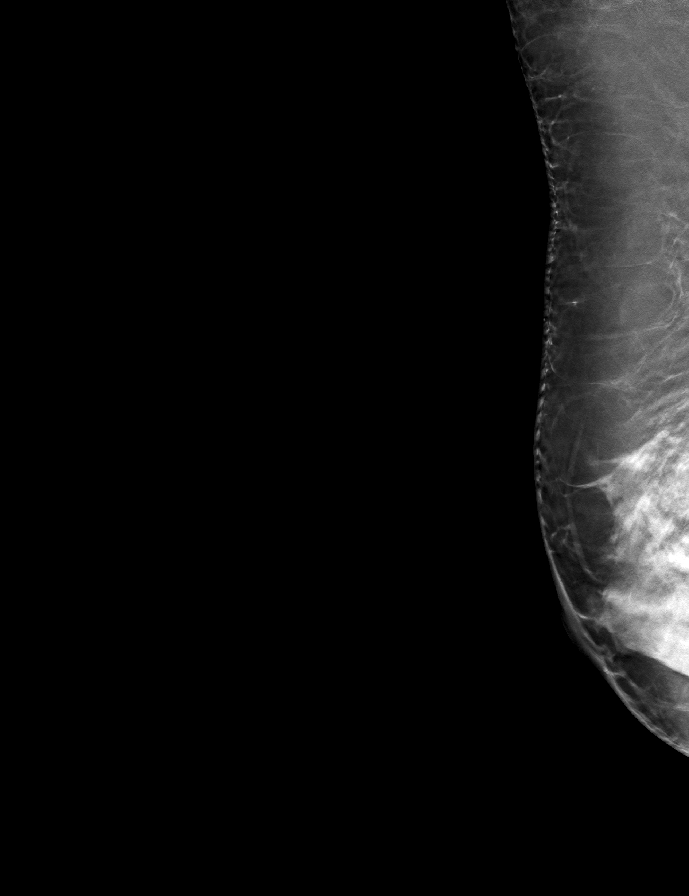

[9 of 28 positions shown; findings below may reference images not displayed]

ACR Breast Density Category d: The breast tissue is extremely dense,
which lowers the sensitivity of mammography.
FINDINGS: The patient has implants. There are no findings suspicious for
malignancy.
IMPRESSION: No mammographic evidence of malignancy. A result letter of this
screening mammogram will be mailed directly to the patient.

RECOMMENDATION:
Screening mammogram in one year. (Code:[id number])

BI-RADS CATEGORY  1:  Negative.
